# Patient Record
Sex: Female | Born: 1965 | Race: White | Hispanic: No | Marital: Married | Smoking: Never smoker
Health system: Southern US, Community
[De-identification: ages and names within clinical notes are randomized; demographics above are authoritative.]

## PROBLEM LIST (undated history)

## (undated) DIAGNOSIS — F419 Anxiety disorder, unspecified: Secondary | ICD-10-CM

## (undated) DIAGNOSIS — G4730 Sleep apnea, unspecified: Secondary | ICD-10-CM

## (undated) DIAGNOSIS — K219 Gastro-esophageal reflux disease without esophagitis: Secondary | ICD-10-CM

## (undated) DIAGNOSIS — N870 Mild cervical dysplasia: Secondary | ICD-10-CM

## (undated) DIAGNOSIS — Z9889 Other specified postprocedural states: Secondary | ICD-10-CM

## (undated) DIAGNOSIS — K589 Irritable bowel syndrome without diarrhea: Secondary | ICD-10-CM

## (undated) DIAGNOSIS — R0683 Snoring: Secondary | ICD-10-CM

## (undated) DIAGNOSIS — E669 Obesity, unspecified: Secondary | ICD-10-CM

## (undated) DIAGNOSIS — R112 Nausea with vomiting, unspecified: Secondary | ICD-10-CM

## (undated) DIAGNOSIS — I10 Essential (primary) hypertension: Secondary | ICD-10-CM

## (undated) DIAGNOSIS — J069 Acute upper respiratory infection, unspecified: Secondary | ICD-10-CM

## (undated) HISTORY — DX: Sleep apnea, unspecified: G47.30

## (undated) HISTORY — DX: Obesity, unspecified: E66.9

## (undated) HISTORY — PX: GYNECOLOGIC CRYOSURGERY: SHX857

## (undated) HISTORY — DX: Gastro-esophageal reflux disease without esophagitis: K21.9

## (undated) HISTORY — DX: Acute upper respiratory infection, unspecified: J06.9

## (undated) HISTORY — DX: Essential (primary) hypertension: I10

## (undated) HISTORY — DX: Mild cervical dysplasia: N87.0

## (undated) HISTORY — DX: Snoring: R06.83

## (undated) HISTORY — DX: Irritable bowel syndrome, unspecified: K58.9

---

## 1987-11-27 HISTORY — PX: CERVICAL CONE BIOPSY: SUR198

## 1999-01-26 ENCOUNTER — Other Ambulatory Visit: Admission: RE | Admit: 1999-01-26 | Discharge: 1999-01-26 | Payer: Self-pay | Admitting: Gynecology

## 2000-03-19 ENCOUNTER — Other Ambulatory Visit: Admission: RE | Admit: 2000-03-19 | Discharge: 2000-03-19 | Payer: Self-pay | Admitting: Gynecology

## 2000-09-03 ENCOUNTER — Other Ambulatory Visit: Admission: RE | Admit: 2000-09-03 | Discharge: 2000-09-03 | Payer: Self-pay | Admitting: Gynecology

## 2002-03-26 ENCOUNTER — Other Ambulatory Visit: Admission: RE | Admit: 2002-03-26 | Discharge: 2002-03-26 | Payer: Self-pay | Admitting: Gynecology

## 2003-04-20 ENCOUNTER — Other Ambulatory Visit: Admission: RE | Admit: 2003-04-20 | Discharge: 2003-04-20 | Payer: Self-pay | Admitting: Gynecology

## 2003-08-17 ENCOUNTER — Encounter: Admission: RE | Admit: 2003-08-17 | Discharge: 2003-09-22 | Payer: Self-pay | Admitting: Gynecology

## 2003-11-11 ENCOUNTER — Inpatient Hospital Stay (HOSPITAL_COMMUNITY): Admission: AD | Admit: 2003-11-11 | Discharge: 2003-11-15 | Payer: Self-pay | Admitting: Gynecology

## 2003-11-12 ENCOUNTER — Encounter (INDEPENDENT_AMBULATORY_CARE_PROVIDER_SITE_OTHER): Payer: Self-pay

## 2003-12-16 ENCOUNTER — Other Ambulatory Visit: Admission: RE | Admit: 2003-12-16 | Discharge: 2003-12-16 | Payer: Self-pay | Admitting: Dermatology

## 2003-12-21 ENCOUNTER — Other Ambulatory Visit: Admission: RE | Admit: 2003-12-21 | Discharge: 2003-12-21 | Payer: Self-pay | Admitting: Gynecology

## 2004-11-26 HISTORY — PX: CHOLECYSTECTOMY: SHX55

## 2005-01-05 ENCOUNTER — Other Ambulatory Visit: Admission: RE | Admit: 2005-01-05 | Discharge: 2005-01-05 | Payer: Self-pay | Admitting: Gynecology

## 2005-01-08 ENCOUNTER — Ambulatory Visit: Payer: Self-pay | Admitting: Internal Medicine

## 2005-01-09 ENCOUNTER — Inpatient Hospital Stay (HOSPITAL_COMMUNITY): Admission: AD | Admit: 2005-01-09 | Discharge: 2005-01-15 | Payer: Self-pay | Admitting: Internal Medicine

## 2005-04-10 ENCOUNTER — Ambulatory Visit (HOSPITAL_COMMUNITY): Admission: RE | Admit: 2005-04-10 | Discharge: 2005-04-10 | Payer: Self-pay | Admitting: Gynecology

## 2005-11-05 ENCOUNTER — Observation Stay (HOSPITAL_COMMUNITY): Admission: EM | Admit: 2005-11-05 | Discharge: 2005-11-07 | Payer: Self-pay | Admitting: Emergency Medicine

## 2006-01-16 ENCOUNTER — Other Ambulatory Visit: Admission: RE | Admit: 2006-01-16 | Discharge: 2006-01-16 | Payer: Self-pay | Admitting: Gynecology

## 2006-06-04 ENCOUNTER — Ambulatory Visit (HOSPITAL_COMMUNITY): Admission: RE | Admit: 2006-06-04 | Discharge: 2006-06-04 | Payer: Self-pay | Admitting: Gynecology

## 2006-06-06 ENCOUNTER — Encounter: Admission: RE | Admit: 2006-06-06 | Discharge: 2006-06-06 | Payer: Self-pay | Admitting: Gynecology

## 2006-11-26 HISTORY — PX: ESOPHAGOGASTRODUODENOSCOPY: SHX1529

## 2006-12-07 IMAGING — US US ABDOMEN COMPLETE
1 series · 14 of 25 positions shown · non-contrast
Comparison: none

CLINICAL DATA: Abdominal pain, dehydration.
 COMPLETE ABDOMINAL ULTRASOUND:
 Multiple scans of the entire abdomen are made and show the gallbladder to be normal with wall thickness of 2.7 mm.  Common bile duct is normal and measures 4.1 mm in diameter.  The liver, inferior vena cava and pancreas appear to be within the normal limit.  Spleen is normal and measures 12.6 cm.  Right kidney is normal and measures 12.8 cm and the left kidney is normal and measures 12.5 cm.  The abdominal aorta is normal and measures 2.2 cm in maximum diameter.  No free fluid is seen within the abdomen.

[Series 1: unknown · 0.34mm/px · 14 of 69 slices shown]
[im 1/69]
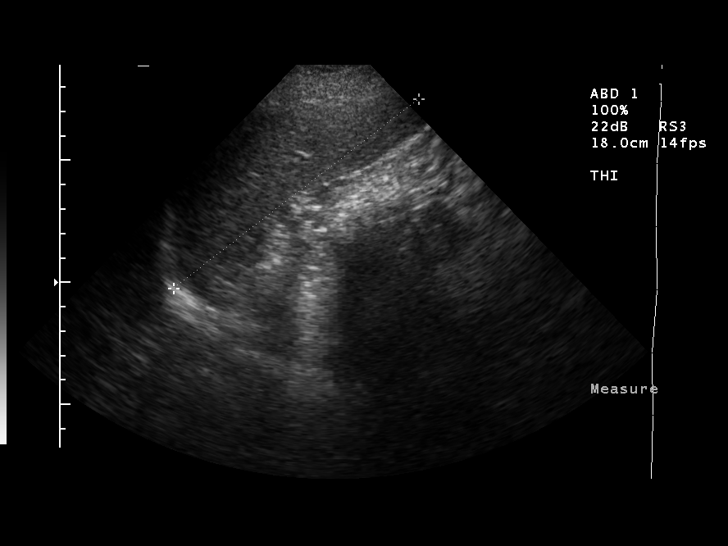
[im 6/69]
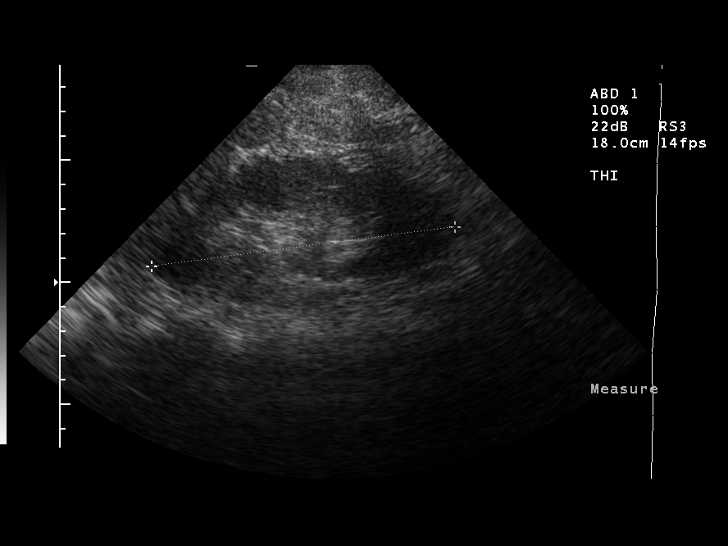
[im 12/69]
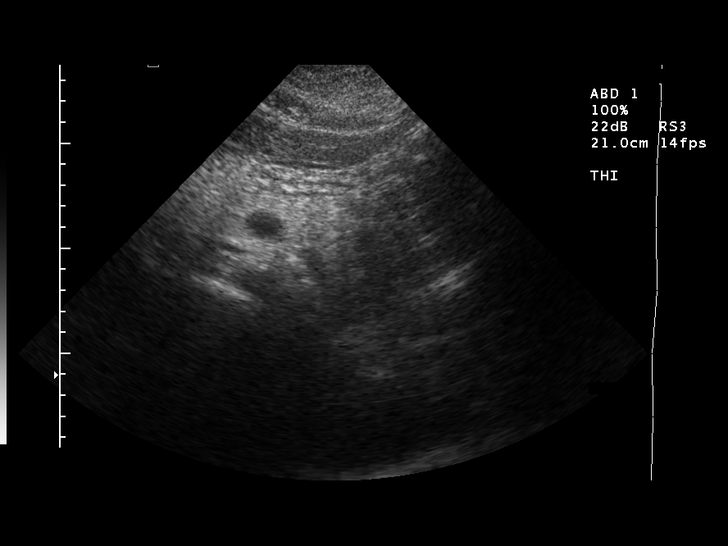
[im 18/69]
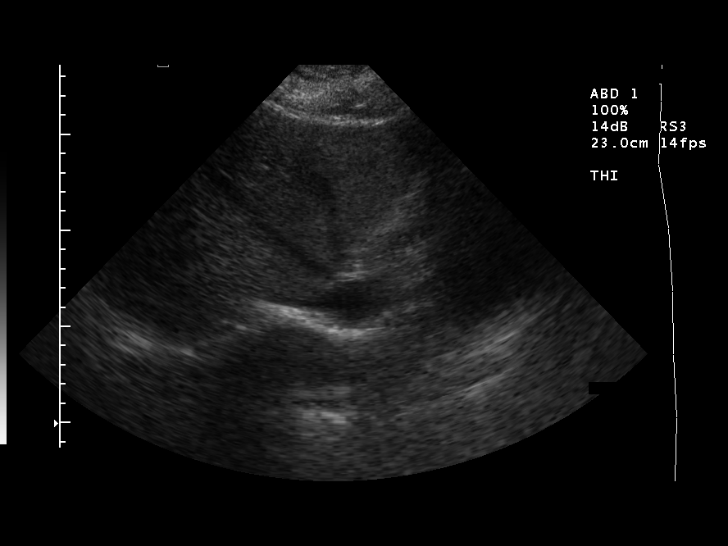
[im 23/69]
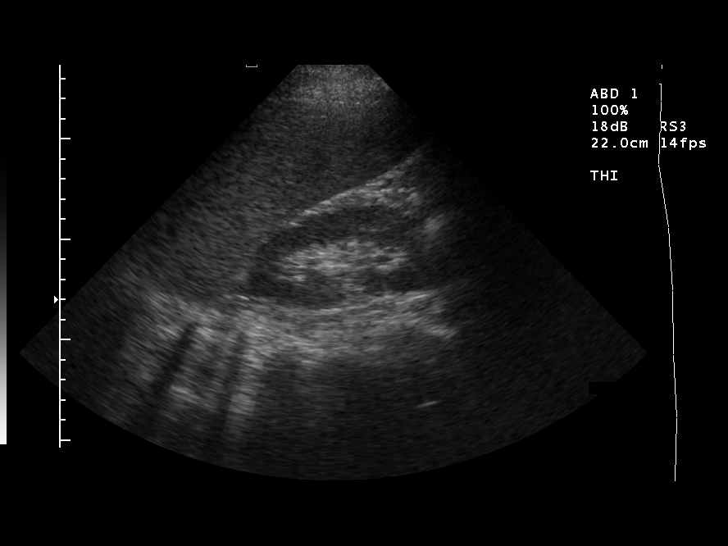
[im 26/69]
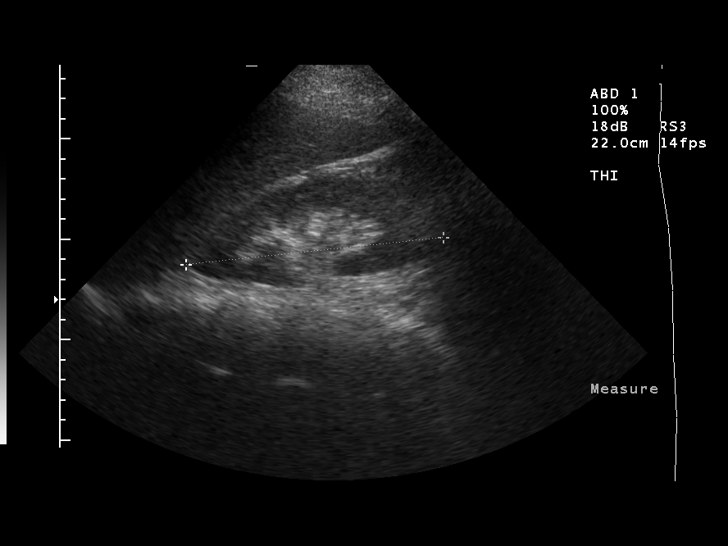
[im 32/69]
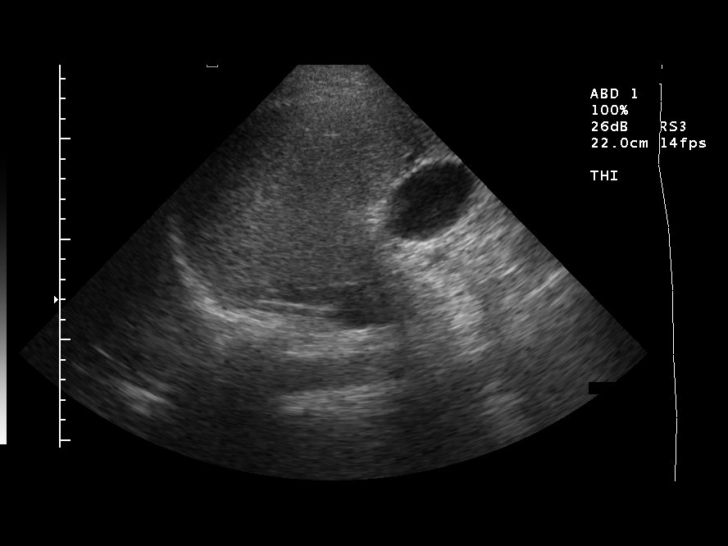
[im 37/69]
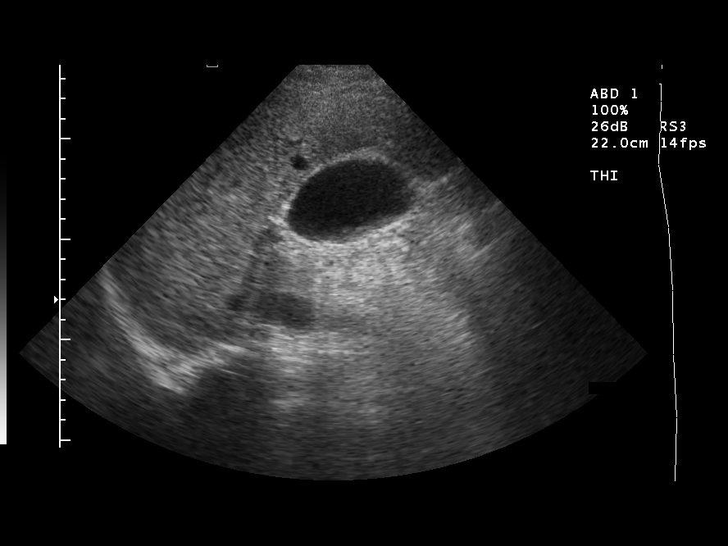
[im 43/69]
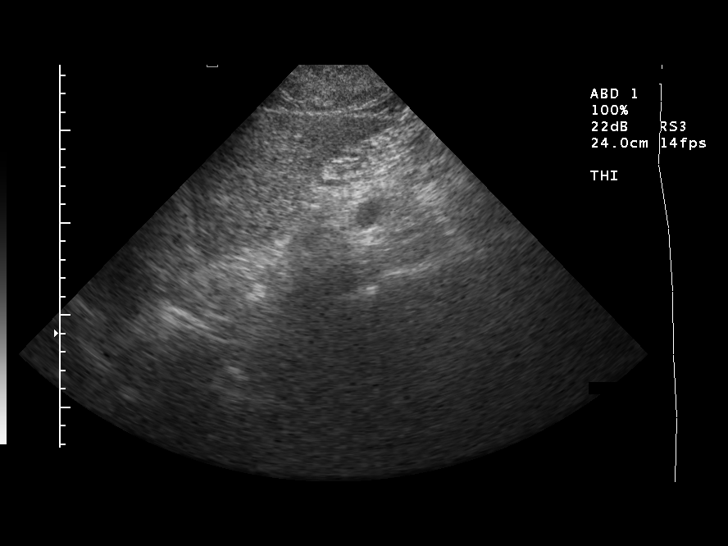
[im 46/69]
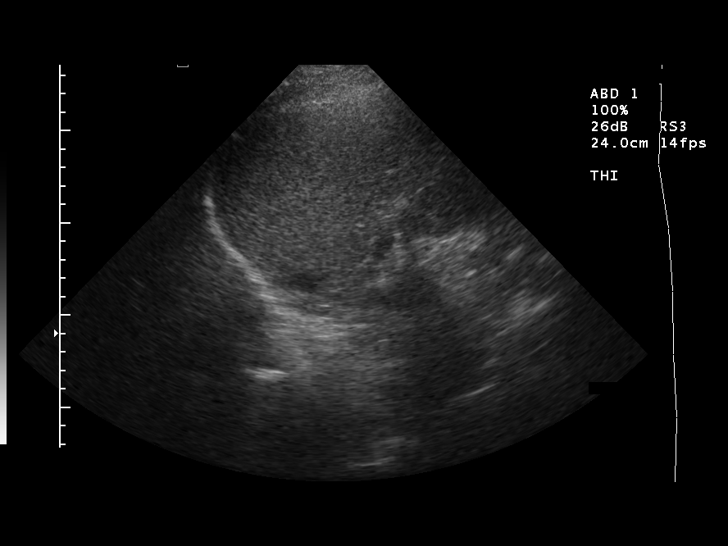
[im 52/69]
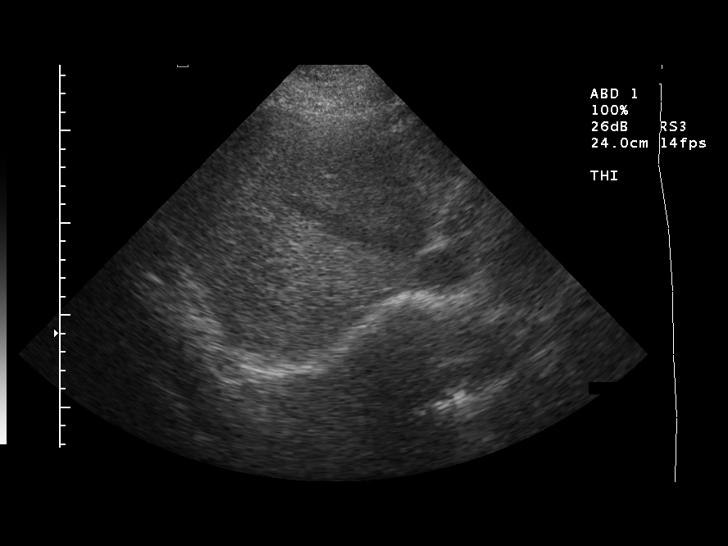
[im 57/69]
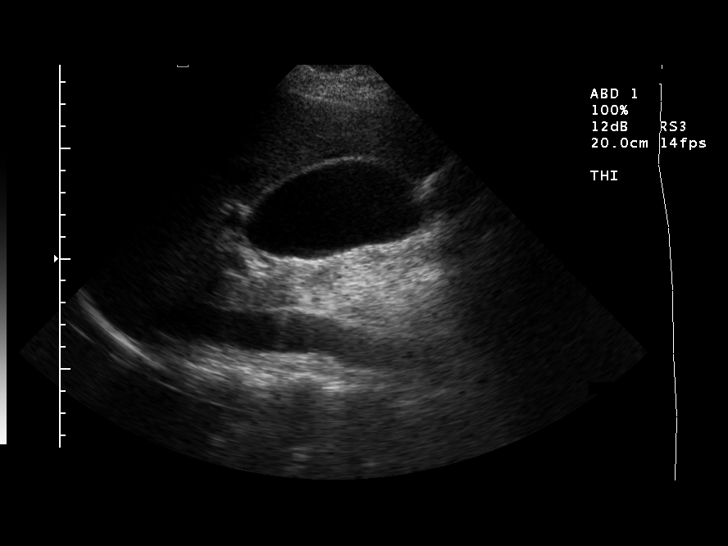
[im 63/69]
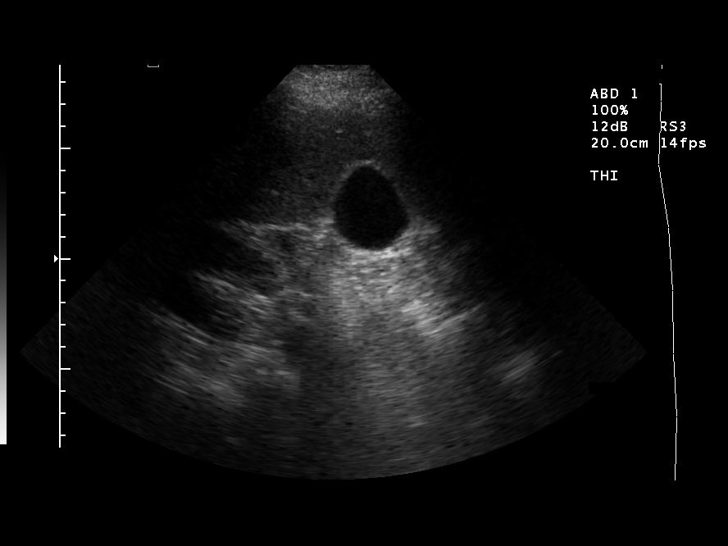
[im 69/69]
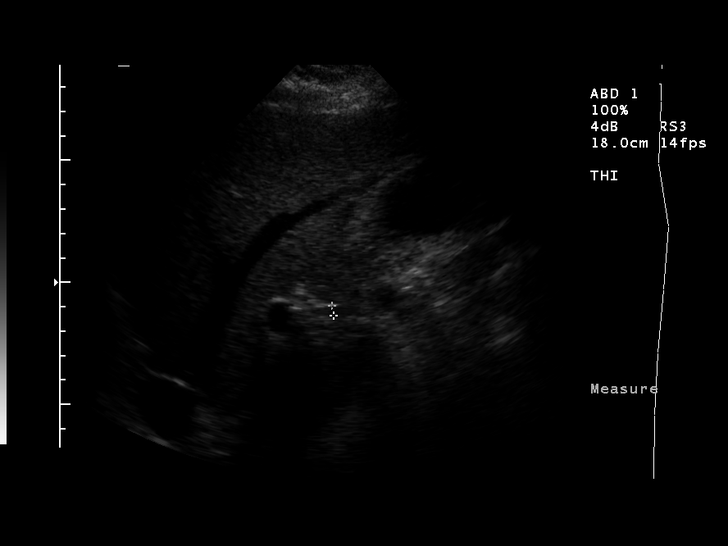

[14 of 25 positions shown; findings below may reference images not displayed]

IMPRESSION: Normal complete abdominal ultrasound.

## 2006-12-09 IMAGING — NM NM HEPATO W/GB/PHARM/[PERSON_NAME]
3 series · 13 of 13 positions shown · non-contrast
Comparison: none

CLINICAL DATA: Abdominal pain.  
 NUCLEAR MEDICINE HEPATOBILIARY SCAN
 Hepatobiliary study was performed after the IV administration of 5.0 mCi Hc-77C Choletec.  Images were performed at different intervals up to one hour.

[Series 1: hepatobiliary · 3.20mm/px · 6 of 60 frames shown (1 of 2)]
[frame 6/60]
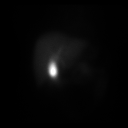
[frame 16/60]
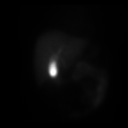
[frame 26/60]
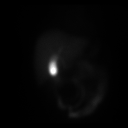
[frame 36/60]
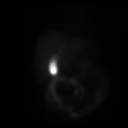
[frame 46/60]
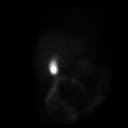
[frame 56/60]
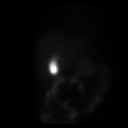

[Series 1: static - general purpose · 2.33mm/px · 1 of 1 slices shown]
[im 1/1]
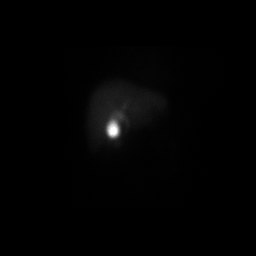

[Series 1: hepatobiliary · 3.20mm/px · 6 of 9 frames shown (2 of 2)]
[frame 1/9]
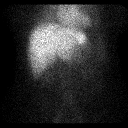
[frame 3/9]
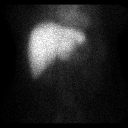
[frame 4/9]
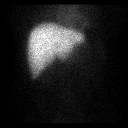
[frame 6/9]
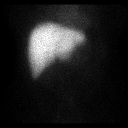
[frame 7/9]
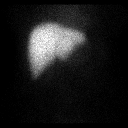
[frame 9/9]
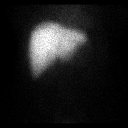

[13 of 13 positions shown; findings below may reference images not displayed]

The first part of the study had to be interrupted because the patient had to go to the restroom; however, at the end of one hour, the activity from the liver was cleared and was concentrated in the gallbladder and small intestine.  Post fatty meal challenge, there was passage of the tracer into the small bowel and the gallbladder emptied with an ejection fraction of 16.5%, suggestive of a moderate degree of biliary dyskinesia/cystic duct syndrome.
IMPRESSION: Decreased gallbladder ejection fraction of 16.5% suggestive of a moderate degree of biliary dyskinesia/cystic duct syndrome.

## 2007-01-21 ENCOUNTER — Other Ambulatory Visit: Admission: RE | Admit: 2007-01-21 | Discharge: 2007-01-21 | Payer: Self-pay | Admitting: Gynecology

## 2007-01-29 ENCOUNTER — Ambulatory Visit: Payer: Self-pay | Admitting: Internal Medicine

## 2007-02-17 ENCOUNTER — Ambulatory Visit (HOSPITAL_COMMUNITY): Admission: RE | Admit: 2007-02-17 | Discharge: 2007-02-17 | Payer: Self-pay | Admitting: Internal Medicine

## 2007-02-17 ENCOUNTER — Ambulatory Visit: Payer: Self-pay | Admitting: Internal Medicine

## 2007-06-18 ENCOUNTER — Ambulatory Visit (HOSPITAL_COMMUNITY): Admission: RE | Admit: 2007-06-18 | Discharge: 2007-06-18 | Payer: Self-pay | Admitting: Gynecology

## 2007-11-27 HISTORY — PX: COLONOSCOPY: SHX174

## 2007-12-22 ENCOUNTER — Emergency Department (HOSPITAL_COMMUNITY): Admission: EM | Admit: 2007-12-22 | Discharge: 2007-12-23 | Payer: Self-pay | Admitting: Emergency Medicine

## 2008-02-17 ENCOUNTER — Other Ambulatory Visit: Admission: RE | Admit: 2008-02-17 | Discharge: 2008-02-17 | Payer: Self-pay | Admitting: Gynecology

## 2008-06-01 ENCOUNTER — Ambulatory Visit: Payer: Self-pay | Admitting: Gastroenterology

## 2008-06-09 ENCOUNTER — Ambulatory Visit (HOSPITAL_COMMUNITY): Admission: RE | Admit: 2008-06-09 | Discharge: 2008-06-09 | Payer: Self-pay | Admitting: Gastroenterology

## 2008-06-09 ENCOUNTER — Encounter: Payer: Self-pay | Admitting: Gastroenterology

## 2008-06-09 ENCOUNTER — Ambulatory Visit: Payer: Self-pay | Admitting: Gastroenterology

## 2008-07-06 ENCOUNTER — Ambulatory Visit (HOSPITAL_COMMUNITY): Admission: RE | Admit: 2008-07-06 | Discharge: 2008-07-06 | Payer: Self-pay | Admitting: Gynecology

## 2008-08-06 ENCOUNTER — Ambulatory Visit: Payer: Self-pay | Admitting: Gastroenterology

## 2009-02-09 ENCOUNTER — Ambulatory Visit: Payer: Self-pay | Admitting: Gastroenterology

## 2009-02-09 DIAGNOSIS — Z9089 Acquired absence of other organs: Secondary | ICD-10-CM

## 2009-02-09 DIAGNOSIS — E669 Obesity, unspecified: Secondary | ICD-10-CM | POA: Insufficient documentation

## 2009-02-09 DIAGNOSIS — R197 Diarrhea, unspecified: Secondary | ICD-10-CM

## 2009-02-09 DIAGNOSIS — K219 Gastro-esophageal reflux disease without esophagitis: Secondary | ICD-10-CM

## 2009-03-01 ENCOUNTER — Ambulatory Visit: Payer: Self-pay | Admitting: Gynecology

## 2009-03-01 ENCOUNTER — Other Ambulatory Visit: Admission: RE | Admit: 2009-03-01 | Discharge: 2009-03-01 | Payer: Self-pay | Admitting: Gynecology

## 2009-03-01 ENCOUNTER — Encounter: Payer: Self-pay | Admitting: Gynecology

## 2009-07-07 ENCOUNTER — Ambulatory Visit (HOSPITAL_COMMUNITY): Admission: RE | Admit: 2009-07-07 | Discharge: 2009-07-07 | Payer: Self-pay | Admitting: Gynecology

## 2009-07-13 ENCOUNTER — Encounter: Payer: Self-pay | Admitting: Gastroenterology

## 2010-01-16 ENCOUNTER — Encounter (INDEPENDENT_AMBULATORY_CARE_PROVIDER_SITE_OTHER): Payer: Self-pay | Admitting: *Deleted

## 2010-03-02 ENCOUNTER — Other Ambulatory Visit: Admission: RE | Admit: 2010-03-02 | Discharge: 2010-03-02 | Payer: Self-pay | Admitting: Gynecology

## 2010-03-02 ENCOUNTER — Ambulatory Visit: Payer: Self-pay | Admitting: Gynecology

## 2010-07-28 ENCOUNTER — Ambulatory Visit: Payer: Self-pay | Admitting: Gynecology

## 2010-12-16 ENCOUNTER — Encounter: Payer: Self-pay | Admitting: *Deleted

## 2010-12-28 NOTE — Letter (Signed)
Summary: Appointment Reminder  [facility]  [address]   [place], [place] [address]   Phone: [phone]  Fax: [phone]       January 16, 2010   [name] [address] [place], [place]  [address] 04-17-1966    Dear Ms. Niemeier,  We have been unable to reach you by phone to schedule a follow up   appointment that was recommended for you by Dr. Darrick Penna. It is very   important that we reach you to schedule an appointment. We hope that you  allow [place] to participate in your health care needs. Please contact us at  [phone] at your earliest convenience to schedule your appointment.  Sincerely,    [facility] R. Roetta Sessions, M.D.    Kassie Mends, M.D. Lorenza Burton, FNP-BC    Tana Coast, PA-C Phone: [phone]    Fax: [phone]

## 2011-04-10 NOTE — Assessment & Plan Note (Signed)
NAME:  [name], [name]                CHART#:  [id number]   DATE:  08/06/2008                       DOB:  21-Mar-1966   REFERRING PHYSICIAN:  Jimmy Footman at Dr. Loraine Leriche Cresenzo's office.   PROBLEM LIST:  1. Diarrhea for 3-4 years.  2. Cholecystectomy.  3. Severe obesity with a BMI of 39.5  4. Gastroesophageal reflux disease.   SUBJECTIVE:  The patient is a 45 year old female, who was last seen in  July 2009 for colonoscopy.  She was complaining of diarrhea.  She has  had diarrhea since she became a mother of 7-year-old.  In July 2009, her  biopsies were negative for microscopic colitis.  She was asked to use  Levsin, fiber, and Colestid.  She reports having 1-2 bowel movements in  the morning, sometimes before she eats.  She has one after lunch.  She  does not have this problem after dinner.  She has formed stool once in  a while.  She has cut down on fatty foods, but not entirely eliminated  them.  She rarely consumes dairy products, but she had ice cream  today.  She takes one Levsin before breakfast and lunch.  She denies any  more stress at work in the home.  She denies having dry mouth, dry eyes,  drowsiness, or urinary retention.  She cannot really tell that the  Colestid may be any different.  She does have pain in her abdomen prior  to having diarrhea, which is relieved after the bowel movement.   MEDICATIONS:  1. Nexium 40 mg daily.  2. Levsin.  3. Colestid.  4. FiberChoice 2 pills daily.   OBJECTIVE:  Physical exam:  VITAL SIGNS:  Weight 209 pounds (up 4 pounds since January 2008), height  5 foot 1 inch, BMI 39.5 (severely obese), temperature 92, blood pressure  124/88, pulse 16.GENERAL:  She is in no apparent distress.  Alert and  oriented x4.  HEENT:  Atraumatic, normocephalic.  Pupils equal and reactive to light.  Mouth, no oral lesions.  Posterior pharynx without erythema or  exudate.NECK:  Full range of motion.  No lymphadenopathy.LUNGS:  Clear  to auscultation  bilaterally.  CARDIOVASCULAR:  Regular rhythm.  No murmur.  Normal S1 and S2.ABDOMEN:  Bowel sounds present.  Soft, nontender, and nondistended.  No rebound or  guarding.EXTREMITIES:  No cyanosis, clubbing, or edema.NEUROLOGIC:  She  has no focal neurologic deficits.   ASSESSMENT:  The patient is a 45 year old female, who has chronic  intermittent diarrhea.  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  The differential diagnosis includes lactose  intolerance, bile salt induced diarrhea, small bowel bacterial  overgrowth, and celiac sprue. Thank you for allowing me to see the  patient in consultation.  My recommendations are follow.   RECOMMENDATIONS:  1. She should increase the Levsin to 2 before breakfast and 2 before      lunch.  She is given her discharge instructions in writing.  She is      cautioned that Levsin again can  cause drowsiness, dry mouth, dry      eyes, and urinary retention.  2. She is to add Colestid 2 in the morning and 1 at night.  3. She is to strictly adhere to a low-fat diet indefinitely and is  going to be placed on a trial of a lactose-free diet for 2 weeks.      If her symptoms do not improve, she has not to worry about dairy      products.  4. Will schedule an upper endoscopy within the next 2-3 weeks to      evaluate for celiac sprue.  5. She has a follow up appointment to see me in 6 weeks.         Kassie Mends, M.D.  Electronically Signed     SM/MEDQ  D:  08/06/2008  T:  08/06/2008  Job:  [id number]   cc:   Susette Racer, M.D.

## 2011-04-10 NOTE — Op Note (Signed)
NAMERHILEE, [name]               ACCOUNT NO.:  [id number]   MEDICAL RECORD NO.:  [id number]          PATIENT TYPE:  AMB   LOCATION:  DAY                           FACILITY:  [facility]   PHYSICIAN:  Kassie Mends, M.D.      DATE OF BIRTH:  Jul 19, 1966   DATE OF PROCEDURE:  06/09/2008  DATE OF DISCHARGE:                               OPERATIVE REPORT   REFERRING Charles Niese:  Margarita Mail with Dr. Patrica Duel.   PROCEDURE:  Colonoscopy with random cold forceps biopsy.   INDICATION FOR EXAMINATION:  [name] is a 45 year old female who had  a gallbladder taken out 3-4 years ago and has had diarrhea.  She  presented with fecal incontinence.  She also has strained fecal urgency  after eating.  She said Bentyl only made her have abdominal cramps and  not go to the bathroom.  She does not recall taking Levsin.  She is  trying fiber.   FINDINGS:  1. Frequent sigmoid colon diverticula.  Otherwise, no polyps, masses,      inflammatory changes, or AVMs seen.  Random biopsies obtained via      cold forceps to evaluate for microscopic colitis.  2. Small internal hemorrhoids.  Otherwise, normal retroflexed view of      the rectum.   DIAGNOSIS:  No obvious source for [name] diarrhea identified.   RECOMMENDATIONS:  1. I will add Levsin 0.125 mg sublingual 30 minutes before breakfast,      lunch, and dinner.  We will also add Colestid 1 g daily and      titrate.  There is a strong possibility that her diarrhea is bile      salt induced.  2. Followup appointment with Dr. Cira Servant in 1 month.  3. She should follow a high-fiber diet.  She is given a handout on      high-fiber diet, diverticulosis, and hemorrhoids.  4. No aspirin, NSAIDs, or anticoagulation for 5 days.   MEDICATIONS:  1. Demerol 75 mg IV.  2. Versed 4 mg IV.   PROCEDURE TECHNIQUE:  Physical exam was performed.  Informed consent was  obtained from the patient after explaining benefits, risks, and  alternatives to the procedure.   The patient was connected to monitor and  placed in left lateral position.  Continuous oxygen was provided by  nasal cannula.  IV medicines were administered through an indwelling  cannula.  After administration of sedation and rectal exam, the  patient's rectum was intubated and the  scope was advanced under direct visualization to the cecum.  The scope  was removed slowly by carefully examining color, texture, anatomy, and  integrity of the mucosa on the way out.  The patient was recovered in  endoscopy and discharged home in satisfactory condition.      Kassie Mends, M.D.  Electronically Signed     SM/MEDQ  D:  06/09/2008  T:  06/09/2008  Job:  [id number]   cc:   Patrica Duel, M.D.  Fax: [phone]   Margarita Mail, PA

## 2011-04-10 NOTE — Consult Note (Signed)
NAMEHERA, [name]               ACCOUNT NO.:  [id number]   MEDICAL RECORD NO.:  [id number]          PATIENT TYPE:  AMB   LOCATION:  DAY                           FACILITY:  [facility]   PHYSICIAN:  Kassie Mends, M.D.      DATE OF BIRTH:  1966/10/25   DATE OF CONSULTATION:  DATE OF DISCHARGE:                                 CONSULTATION   REASON FOR CONSULTATION:  IVS symptoms.   PHYSICIAN REQUESTING CONSULTATION:  Jimmy Footman with Dr. Patrica Duel.   PHYSICIAN CONCERNING NOTE:  Kassie Mends, MD.   HISTORY OF PRESENT ILLNESS:  [name] is a 45 year old lady who presents  today for further evaluation of IVS symptoms.  We last saw her in March  2008.  She was having refractory GERD at that time and underwent an EGD  and was found to have small sliding hiatal hernia and nonerosive antral  gastritis.  Her H. pylori serologies were negative.  She states that she  has had issues with her bowel movements ever since the birth of her  child who is now 9 years old.  She had a cesarean section with him.  Ever since then, she has had fecal urgency, especially postprandially.  She has anywhere from 5 minutes to an hour and half after eating, will  have fecal urgency with little notice to get to the bathroom.  She has  had episodes of incontinence.  This usually occurs in the mornings and  after lunch, rarely does occur after her evening meal.  She never has  nocturnal bowel movements.  Rarely does she have a solid stool.  She  generally has 3-4 stools a day.  No blood or melena.  No unintentional  weight loss.  She also has gastroesophageal reflux disease.  When she  has breakthrough symptoms which she usually occurs 2 times a week, it is  usually nocturnally.  She is already on Nexium 40 mg daily.  She  previously failed Prilosec.  For her bowel symptoms, she has been on  Align daily for 2 weeks and has been eating Fiber One bars for the past  3 weeks.  She really has not noticed any difference.  She  previously  failed Levsin and Bentyl.  She denies any significant abdominal pain  other than the urge to have a bowel movement.  This pain goes away as  soon as she has a bowel movement.  She has had no vomiting.  No  dysphagia or odynophagia.  The patient states her thyroid was checked  with labs in February 2009 were normal.   CURRENT MEDICATIONS:  1. Nexium 40 mg daily.  2. Prenatal vitamin daily.  3. Align daily.  4. Naproxen 1-2 daily, she recently started for pain.   ALLERGIES:  PENICILLIN.   PAST MEDICAL HISTORY:  Gastroesophageal reflux disease, anxiety,  depression, she has a history of gestational diabetes, prior EGD as  outlined above, cesarean section in 2004, and cholecystectomy in 2006.   FAMILY HISTORY:  Negative for colorectal cancer or chronic GI illnesses.   SOCIAL HISTORY:  She is married.  She has a 47-year-old son.  She is a  Engineer, site at [employer].  She has never been a  smoker.  Occasionally drinks beer.   REVIEW OF SYSTEMS:  See HPI for GI.  CONSTITUTIONAL:  No unintentional  weight loss.  CARDIOPULMONARY:  No chest pain, shortness of breath,  palpitations, or cough.  GENITOURINARY:  No dysuria or hematuria.   PHYSICAL EXAM:  VITAL SIGNS:  Weight 211, temp 98.8, blood pressure  130/96, and pulse 72.  GENERAL:  Pleasant obese Caucasian female in no acute distress.  SKIN:  Warm and dry.  No jaundice.  HEENT:  Sclerae nonicteric.  Oropharyngeal mucosa moist and pink.  No  lesions, erythema, or exudate.  CARDIAC:  Reveals regular rate and rhythm.  No murmurs, rubs, or  gallops.  CHEST:  Lungs clear to auscultation.  ABDOMEN:  Positive bowel sounds, soft, nontender, and nondistended.  No  organomegaly or masses.  No rebound or guarding.  No abdominal bruits or  hernias.  LOWER EXTREMITIES:  No edema.   IMPRESSION:  [name] is a 45 year old lady with chronic diarrhea now  occurring for about 4 years.  Very little abdominal pain  associated with  these symptoms.  She has significant postprandial fecal urgency as well  as periods of fecal incontinence.  Symptoms may be secondary to  irritable bowel syndrome, but other etiologies need be excluded and  including celiac disease, irritable bowel syndrome, and less likely  microscopic colitis.  She has chronic gastroesophageal reflux disease  with breakthrough symptoms couple times a week, but no alarm symptoms at  this time.   PLAN:  1. IBS and GERD literature provided to the patient.  She will stop      eating peppermint which she has been doing for breakthrough reflux.  2. FiberChoice 2 tablets daily.  3. Trial of HyoMax 0.375 mg one b.i.d. p.r.n. diarrhea and cramps, #60      one refill.  4. Complete 4-week course of the Align.  5. Continue Nexium 30 minutes before breakfast and may take an      additional dose of 30 minutes before supper if needed or may take      OTC acid reducer in the evenings as needed for breakthrough      symptoms.  6. Celiac antibody panel, CMET, CBC.  7. Colonoscopy in near future with Dr. Cira Servant.  8. Further recommendations to follow.   I would like to thank Jimmy Footman with Dr. Patrica Duel for allowing [place]  to take part in the care of this patient.      Tana Coast, P.A.      Kassie Mends, M.D.  Electronically Signed    LL/MEDQ  D:  06/01/2008  T:  06/02/2008  Job:  [id number]   cc:   Patrica Duel, M.D.  Fax: [phone]

## 2011-04-13 NOTE — H&P (Signed)
NAME:  [name], [name]                         ACCOUNT NO.:  [id number]   MEDICAL RECORD NO.:  [id number]                   PATIENT TYPE:  INP   LOCATION:  9160                                 FACILITY:  [facility]   PHYSICIAN:  Timothy P. Fontaine, M.D.           DATE OF BIRTH:  07-17-1966   DATE OF ADMISSION:  11/11/2003  DATE OF DISCHARGE:                                HISTORY & PHYSICAL   CHIEF COMPLAINT:  1. Pregnancy at term.  2. Gestational diabetes, insulin requiring.   HISTORY OF PRESENT ILLNESS:  A 45 year old G1 P0 female at [redacted] weeks  gestation with gestational diabetes requiring p.m. NPH insulin for control.  The patient is admitted at this time for induction due to her insulin-  dependent diabetes.  Her prenatal course has otherwise been uncomplicated.  She is advanced maternal age and has declined amniocentesis.  For the  remainder of her history, see her [place].   PHYSICAL EXAMINATION:  VITAL SIGNS:  Afebrile, vital signs are stable.  HEENT:  Normal.  LUNGS:  Clear.  CARDIAC:  Regular rate without rubs, murmurs, or gallops.  ABDOMEN:  Gravid, vertex fetus.  External monitor shows reactive fetal  tracing, contractions every two to three minutes.  PELVIC:  Cervix 50%, fingertip, -3 station.   ASSESSMENT AND PLAN:  A 45 year old gravida 1 para 0 at 39 weeks,  gestational diabetes insulin dependent, for serial induction.                                               Timothy P. Audie Box, M.D.    TPF/MEDQ  D:  11/12/2003  T:  11/12/2003  Job:  [id number]

## 2011-04-13 NOTE — H&P (Signed)
[name], [name]               ACCOUNT NO.:  [id number]   MEDICAL RECORD NO.:  [id number]          PATIENT TYPE:  INP   LOCATION:  A201                          FACILITY:  [facility]   PHYSICIAN:  Patrica Duel, M.D.    DATE OF BIRTH:  12-11-1965   DATE OF ADMISSION:  11/05/2005  DATE OF DISCHARGE:  LH                                HISTORY & PHYSICAL   CHIEF COMPLAINT:  Chest pain, nausea and vomiting.   HISTORY OF PRESENT ILLNESS:  This is a 45 year old female with a history of  gastroesophageal reflux disease, allergic rhinitis. She underwent  cholecystectomy in February of this year for acute cholecystitis.   The patient developed nausea, vomiting and diarrhea approximately 24 hours  prior to presenting to the emergency department. She denies any melena,  hematemesis or hematochezia. She developed very hard substernal chest pain  soon after an episode of emesis. She also became diaphoretic and near  syncopal. The pain persisted, and she was brought to the emergency  department.   In the emergency department, cardiac enzymes were negative. EKG was  nonacute. Other pertinent parameters include mildly elevated white count of  10,600. Otherwise normal hemogram, normal MET7 and liver functions with a  mildly elevated SGOT of 57. Chest x-ray was negative. O2 saturation 100%.   Risk factors for coronary disease are unknown lipid status only. Her family  history is negative for coronary artery disease. She is a nonsmoker and has  no history of hypertension.   The patient is admitted with chest pain which is somewhat atypical for  coronary syndrome and probably related to gastroenteritis/esophagitis.   There is no history of headache, neurological deficits, cough, hemoptysis,  abdominal pain, or genitourinary symptoms.   PAST HISTORY:  As noted. She is also status post Cesarean section.   FAMILY HISTORY:  As noted above.   REVIEW OF SYSTEMS:  Negative except as mentioned.   SOCIAL HISTORY:  As noted.   PHYSICAL EXAMINATION:  GENERAL:  This is a very pleasant, fully alert female  who is moderately obese. She is in no acute distress.  VITAL SIGNS:  BP 134/67. She is afebrile. Pulse 85, respirations 20. O2  saturation 100%.  HEENT:  Normocephalic, atraumatic. There is no scleral icterus. Ears, nose  and throat are benign.  NECK:  Supple. No bruits, thyromegaly or lymphadenopathy.  LUNGS:  Clear.  HEART:  Sounds are normal without murmurs, rubs or gallops.  ABDOMEN:  Nontender, nondistended. Bowel sounds are intact.  EXTREMITIES:  No clubbing, cyanosis, or edema.  NEUROLOGICAL:  Fully intact.   ASSESSMENT:  Somewhat atypical chest after an episode of emesis. Consider  coronary syndrome but seems unlikely. Mild elevation of SGOT of questionable  significance. Known gastroesophageal reflux disease.   PLAN:  Will admit for rule out MI protocol. Repeat ultrasound of the liver  and gallbladder bed to rule out the possibility of common duct stone though  her symptoms are somewhat atypical. Also obtain hepatitis screen and workup  liver functions pending her progress.      Patrica Duel, M.D.  Electronically Signed  MC/MEDQ  D:  11/06/2005  T:  11/06/2005  Job:  [id number]

## 2011-04-13 NOTE — Discharge Summary (Signed)
[name], [name]               ACCOUNT NO.:  [id number]   MEDICAL RECORD NO.:  [id number]          PATIENT TYPE:  INP   LOCATION:  A306                          FACILITY:  [facility]   PHYSICIAN:  Madelin Rear. Sherwood Gambler, MD  DATE OF BIRTH:  Jun 17, 1966   DATE OF ADMISSION:  01/09/2005  DATE OF DISCHARGE:  02/20/2006LH                                 DISCHARGE SUMMARY   DISCHARGE MEDICATIONS:  1.  Biaxin 500 mg p.o. b.i.d.  2.  Flonase nasal spray 0.5%, two sprays b.i.d. p.r.n.  3.  Astelin nasal spray, two sprays b.i.d. p.r.n.  4.  Demerol 50 mg tablets q.4h. p.r.n. pain.   DISCHARGE DIAGNOSES:  1.  Acute cholecystitis.  2.  Allergic rhinitis.  3.  Gastroesophageal reflux disease.  4.  Acute sinusitis.  5.  Hypokalemia.   SUMMARY:  The patient was admitted with abdominal pain and recurrent  vomiting.  She was notably tender in the right upper quadrant.  Subsequent  workup including a hepatobiliary scan showed a calculus cholecystitis.  She  was hydrated in the hospital, given adequate analgesic and antibiotic  coverage.  She was seen in consultation by Dr. Katrinka Blazing for surgery who  concurred with the diagnosis.  She was also seen with gastroenterology as  well.  Subsequently, she underwent laparoscopic cholecystectomy with  improvement and discharged stable.      LJF/MEDQ  D:  01/24/2005  T:  01/24/2005  Job:  [id number]

## 2011-04-13 NOTE — H&P (Signed)
NAMEDERRICK, ORRIS               ACCOUNT NO.:  [id number]   MEDICAL RECORD NO.:  [id number]          PATIENT TYPE:  INP   LOCATION:  A226                          FACILITY:  [facility]   PHYSICIAN:  Madelin Rear. Sherwood Gambler, MD  DATE OF BIRTH:  20-Oct-1966   DATE OF ADMISSION:  01/09/2005  DATE OF DISCHARGE:  LH                                HISTORY & PHYSICAL   CHIEF COMPLAINT:  Abdominal pain and vomiting.   HISTORY OF PRESENT ILLNESS:  The patient presents with one day of severe  polymyalgia followed immediately by abdominal pain and vomiting  repetitively.  She had intractable emesis and has not held any fluids down  for 24 hours.  She does admit to some thirst.  She has persistent epigastric  and right upper quadrant pain.  She did admit to possibly some brownish-  coffee ground emesis.  She denied hematemesis, and no hematochezia or melena  was noted.  No fever, rigors, or chills.  No cough or sputum.   PAST MEDICAL HISTORY:  1.  Gastroesophageal reflux disease.  2.  Long-term use of Wellbutrin.   SOCIAL HISTORY:  Nonsmoker, nondrinker of alcohol.  Has a 53-month-old child  at home that she cares for.  Is married and lives with her husband,  supportive family.   FAMILY HISTORY:  Positive for hypertension.  Paternal grandmother deceased  from cancer of unknown source and maternal grandmother with asthma, sister  with hypertension.   REVIEW OF SYSTEMS:  As under HPI.  All else is negative.  She did admit to  some brief episodes of chest discomfort during this, possibly related to the  vomiting.  No palpitations.   PHYSICAL EXAMINATION:  GENERAL:  She appears distressed, actively throwing  up in the office.  HEAD AND NECK:  No JVD or adenopathy.  Dry mucous membranes are noted.  CHEST:  Clear.  CARDIAC:  Regular rhythm without murmur, gallop, or rub.  ABDOMEN:  Normoactive bowel sounds.  No organomegaly or masses.  Positive  epigastric and right upper quadrant tenderness with a  weakly positive  Murphy's sign.  No guarding or rebound.  EXTREMITIES:  Without clubbing, cyanosis, or edema.  NEUROLOGIC:  Normal.   LABORATORY DATA AND OTHER STUDIES:  Laboratories are pending at present,  will review when available.   IMPRESSION:  Differential diagnostic possibilities include peptic ulcer  disease, upper gastrointestinal bleed, Mallory-Weiss tear, cholecystitis,  pancreatitis, hepatitis, and bowel obstruction. These issues will be  resolved with review of laboratory testing.  May also need to get her  admitted for IV hydration and symptom control.      LJF/MEDQ  D:  01/09/2005  T:  01/09/2005  Job:  [id number]

## 2011-04-13 NOTE — Op Note (Signed)
NAME:  [name], [name]                         ACCOUNT NO.:  [id number]   MEDICAL RECORD NO.:  [id number]                   PATIENT TYPE:  INP   LOCATION:  9160                                 FACILITY:  [facility]   PHYSICIAN:  Timothy P. Fontaine, M.D.           DATE OF BIRTH:  1966-01-06   DATE OF PROCEDURE:  11/12/2003  DATE OF DISCHARGE:                                 OPERATIVE REPORT   PREOPERATIVE DIAGNOSES:  1. Pregnancy at term.  2. Gestational diabetes, insulin dependent.  3. Nonreassuring fetal tracing.   POSTOPERATIVE DIAGNOSES:  1. Pregnancy at term.  2. Gestational diabetes, insulin dependent.  3. Nonreassuring fetal tracing.  4. Leiomyomata.   PROCEDURE:  Primary low transverse cervical cesarean section.   SURGEON:  Timothy P. Fontaine, M.D.   ASSISTANT:  Scrub technician.   ANESTHETIC:  Spinal.   ESTIMATED BLOOD LOSS:  500 mL.   SPECIMENS:  1. Placenta.  2. Samples of cord blood.   FINDINGS:  At 2006, normal female, Apgars 9 and 9, weight 5 pounds 12 ounces,  nuchal cord x 1 noted, posterior mid subserosal leiomyomata approximately 2  cm noted.  Fallopian tubes and ovaries grossly normal bilaterally.   DESCRIPTION OF PROCEDURE:  The patient was taken to the operating room,  underwent spinal anesthesia, was placed in left total supine position,  received an abdominal preparation with Betadine solution, Foley catheter  placed in sterile technique, and the patient was draped in the usual  fashion.  After assuring adequate anesthesia, the abdomen was sharply  entered through a Pfannenstiel incision achieving adequate hemostasis at all  levels.  Bladder flap was sharply and bluntly developed without difficulty.  Uterus sharply entered and lower uterine segment bluntly extended laterally.  The membranes were ruptured, the fluid noted to be clear.  The infant's head  delivered through the incision.  Nares and mouth suctioned.  The nuchal cord  reduced.  The rest of  the infant delivered, the cord doubly clamped and cut,  and the infant was handed to pediatrics in attendance.  Samples of cord  blood were obtained.  The placenta was then spontaneously extruded, noted to  be intact and was sent to pathology.  The uterus was exteriorized.  The  endometrial cavity explored with a sponge to remove all placental membrane  fragments.  The patient received 1 g ciprofloxacin IV prophylaxis.  The  uterine incision was closed in one layer using 0 Vicryl suture in a running  interlocking stitch.  Two figure-of-eight sutures were placed at the  incision line to achieve ultimate hemostasis.  The uterus was returned to  the abdomen which was copiously irrigated.  Adequate hemostasis visualized.  The anterior fascia was reapproximated using 0 Vicryl suture in a running  stitch.  The subcutaneous tissue was irrigated.  Hemostasis achieved with  electrocautery.  The skin reapproximated using 4-0 Vicryl in a running  subcuticular stitch.  Benzoin and  Steri-Strips applied.  Sterile dressing  applied.  The patient taken to the recovery room in good condition having  tolerated the procedure well.                                               Timothy P. Audie Box, M.D.    TPF/MEDQ  D:  11/12/2003  T:  11/14/2003  Job:  [id number]

## 2011-04-13 NOTE — Op Note (Signed)
[name], [name]               ACCOUNT NO.:  [id number]   MEDICAL RECORD NO.:  [id number]          PATIENT TYPE:  INP   LOCATION:  A306                          FACILITY:  [facility]   PHYSICIAN:  Dirk Dress. Katrinka Blazing, M.D.   DATE OF BIRTH:  1966-10-25   DATE OF PROCEDURE:  01/14/2005  DATE OF DISCHARGE:                                 OPERATIVE REPORT   PREOPERATIVE DIAGNOSIS:  Cholecystitis, acalculous type.   POSTOPERATIVE DIAGNOSIS:  Acalculous cholecystitis.   PROCEDURE:  Laparoscopic cholecystectomy.   SURGEON:  Dr. Katrinka Blazing.   DESCRIPTION:  Under general anesthesia, the patient's abdomen was prepped  and draped in the sterile field. Supraumbilical incision was made and Veress  needle was inserted uneventfully. The abdomen was insufflated with 3 liters  of CO2. Using a Visiport guide, a 10- mm port was placed uneventfully.  Laparoscope was placed. The gallbladder was visualized. Under videoscopic  guidance, a 10-mm port and two 5-mm ports were placed without difficulty.  The gallbladder was grasped and positioned. The cystic artery was dissected,  clipped with four clips and divided. Cystic duct was dissected, clipped with  four clips, and divided. Using electrocautery _____________ , the  gallbladder was separated from the intrahepatic bed without difficulty. It  was placed in an EndoCatch device and retrieved. There was minimal bleeding  of the bed of the liver. Hemostasis was adequate. There was no evidence of  bile leak. Fluid was used to irrigate the area, but the fluid was clear. The  patient tolerated the procedure well. CO2 was allowed to escape from the  abdomen and the ports were removed. The incisions were then closed using 0  Vicryl on the fascia, the supraumbilical incision, staples on the skin of  all of the incisions. Dressings were placed.   The patient was awakened from anesthesia uneventfully, transferred to a bed  and taken to the postanesthetic care unit for  monitoring.      LCS/MEDQ  D:  01/14/2005  T:  01/15/2005  Job:  [id number]

## 2011-04-13 NOTE — Consult Note (Signed)
[name], [name]               ACCOUNT NO.:  [id number]   MEDICAL RECORD NO.:  [id number]          PATIENT TYPE:  INP   LOCATION:  A226                          FACILITY:  [facility]   PHYSICIAN:  R. Roetta Sessions, M.D. DATE OF BIRTH:  1965-12-20   DATE OF CONSULTATION:  01/10/2005  DATE OF DISCHARGE:                                   CONSULTATION   REASON FOR CONSULTATION:  Nausea, vomiting, and abdominal pain.   HISTORY OF PRESENT ILLNESS:  [name] is a pleasant, obese, 45-year-  old Caucasian female with a history of gestational diabetes.  She was  admitted to the hospital on 01/09/2005 after developing acute illness  characterized by being awakened from a sound sleep at about 0100 on  01/09/2005 with nausea and vomiting which was incessant for the next several  hours. She started having some vague epigastric pain along with these  symptoms as well. She has had not had any melena, rectal bleeding, diarrhea,  or constipation. She has not had any fever or chills.  There has been no  obvious hematemesis.  She was admitted to the hospital by Dr. Sherwood Gambler for  further evaluation.  Acute abdominal series done on February 14 demonstrated  a normal bowel gas pattern. Labs also revealed an essentially normal CBC  with a white count of 9.6, H&H 13.9 and 40.2.  Sodium 132, potassium 3.6,  chloride 102, CO2 25, glucose 111, BUN 7, creatinine 0.8, total bilirubin  0.4, alkaline phosphatase 92.  SGOT 18, SGPT 18, albumin 3.4, amylase 62,  lipase 17, beta hCG was negative.   Abdominal ultrasound was done today.  I just reviewed it with Dr. Lorin Donaldson.  The hepatobiliary tree appeared normal.  A slightly echogenic and __________  value; pancreas, normal aorta and kidneys.   [name] is feeling better with symptomatic treatment including Protonix,  Zofran, and IV fluids.  She wants to have something to eat and drink.   No one else around her has been sick.  She denies any unusual travel or  food  intake.  She has well water at home, but has not had any apparent problems.  With it she denies any similar illness.  No history of peptic ulcer disease.  She does have chronic reflux disease which she describes as heartburn, but  no odynophagia nor dysphagia.  She has been on Nexium at home for some time.  She takes it regularly, she has no symptoms.  She relates having heartburn  for approximately 3 years.  She does not take any nonsteroidal agents.  There is no history of any prior gastrointestinal illnesses.  She has a  history of gestational diabetes which required insulin back in 2004.  She  does check her blood sugars somewhat sporadically and tells me her blood  sugars have been within normal limits without any specific therapy.  She has  lost 11 pounds since November as an attempt to become more healthy.   PAST MEDICAL HISTORY:  Gastroesophageal reflux disease with a history of  Wellbutrin use, status post C-section in 2004 in [place].  ALLERGIES:  To PENICILLIN.   FAMILY HISTORY:  Negative for chronic GI or liver disease.   SOCIAL HISTORY:  The patient does not use alcohol to tobacco.  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  She has  a 50-month-old child at home. She has a supportive husband. She also has a  51 year old step-daughter at home.   REVIEW OF SYSTEMS:  As in history of present illness.  No chest pain, or  dyspnea on exertion.   PHYSICAL EXAMINATION:  GENERAL:  Reveals a 45 year old lady, resting  comfortably in no acute distress.  VITAL SIGNS:  Temperature 97.8, pulse 104, BP 111/69.  Her weight is 195  pounds.  SKIN:  Warm and dry.  No jaundice.  No cutaneous stigmata of chronic liver  disease.  HEENT:  No scleral icterus. Conjunctivae are pink.  Oral cavity has no  lesions.  NECK:  JVD is not prominent.  CHEST:  Lungs are clear to auscultation.  CARDIAC:  Regular rate and rhythm without murmur, gallop, or  rub.  BREASTS:  Exam is deferred.  ABDOMEN:  Obese, positive bowel sounds.  Abdomen is tender in the  epigastrium and right upper quadrant.  No appreciable mass or organomegaly.  EXTREMITIES:  No edema.   ADMITTING IMPRESSION:  [name] is a pleasant, 45 year old lady admitted  to the hospital with the acute onset of nausea, vomiting and some epigastric  pain.   She is feeling better today.  Due to the acute onset of her symptoms one  would wonder about a toxin mediated food borne illness, a viral syndrome is  not excluded.  She has chronic GERD, but I do not get a sense that this is  anything like an exacerbation of her chronic GERD symptoms.   Likewise there is nothing to go with hepatobiliary disease or pancreatitis  either.   I doubt peptic ulcer disease.   Given her history of gestational diabetes, it is certainly possible that she  could have an element of gastroparesis unmasked by an acute illness  compounding her symptoms of nausea and vomiting.   RECOMMENDATIONS:  1.  We will go ahead and let her have a clear liquid diet.  We will give her      Reglan 5 mg IV q.6h. x4 doses.  2.  Continue her symptomatic treatment including scheduled PPI therapy.  3.  Would hold off on an EGD and will reevaluate her tomorrow morning.      Hopefully her outlook is excellent.   I would like to thank Dr. Sherwood Gambler for letting me see this nice lady today.     RMR/MEDQ  D:  01/10/2005  T:  01/10/2005  Job:  [id number]   cc:   Madelin Rear. Sherwood Gambler, MD  [address]  [place]  [place] [address]  Fax: [phone]

## 2011-04-13 NOTE — Consult Note (Signed)
[name], [name]               ACCOUNT NO.:  [id number]   MEDICAL RECORD NO.:  [id number]           PATIENT TYPE:  AMB   LOCATION:                                FACILITY:  [facility]   PHYSICIAN:  Lionel December, M.D.    DATE OF BIRTH:  12-Nov-1966   DATE OF CONSULTATION:  01/29/2007  DATE OF DISCHARGE:                                 CONSULTATION   REFERRING PHYSICIAN:  Dr. Dionne Ano. McGough.   PRESENTING COMPLAINT:  Heartburn refractive to therapy.   HISTORY OF PRESENT ILLNESS:  Mellissa Donaldson is a 45 year old Caucasian femalemale  who is referred through the courtesy of Dr. Regino Schultze for a GI evaluation.  She has at least a 4-year history of heartburn which started during her  pregnancy.  Ever since, she has been on a PPI.  She states Nexium has  worked very well, until about 4 months ago.  Now she has nocturnal  symptoms.  She generally starts with a tickle in her throat,  regurgitation, heartburn and then she has vomiting spells.  These  symptoms do not occur every day, but a couple of times a week and  certain foods almost always induce these symptoms.  She denies  dysphagia.  She has had a very good appetite and she has gained about 10  pounds in 2 years.  She also complains of postprandial diarrhea and  bowel urgency; this generally occurs after breakfast and lunch and  usually not after evening meals.  She denies melena or rectal bleeding.  She was given Levsin and Reglan by Dr. Regino Schultze.  She developed dizziness  and she discontinued both these medicines.  She has never undergone EGD.  She states she has taken Prevacid and Prilosec in the past, but they  have not worked.  She denies hematemesis, melena or rectal bleeding.   MEDICATIONS:  1. Nexium 40 mg q.a.m.  2. Cleocin 150 mg t.i.d., begun yesterday for dental problems.  3. Hydrocodone with APAP 5/500 q.6 h. p.r.n.   PAST MEDICAL HISTORY:  1. Chronic GERD, as above.  2. She saw her dentist yesterday for toothache and was begun  and      Cleocin and hydrocodone.  3. She had a C-section in 2004.  4. Laparoscopic cholecystectomy in February 2006 when she was      hospitalized and seen in consultation by Dr. Jena Gauss.  5. Her prior consultation in our office was in March 2004 for      heartburn.  6. Finally, she had EGD back in November 1990 for epigastric pain,      nausea, vomiting and her EGD was normal.  It is interesting that      her ultrasound and OCG at that time were also normal.   ALLERGIES:  PENICILLIN.   FAMILY HISTORY:  Both parents and 2 sisters are in good health.   SOCIAL HISTORY:  She is married.  She has a 34-year-old son.  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  She has never smoked cigarettes and drinks alcohol  occasionally.  PHYSICAL EXAM:  GENERAL:  A pleasant, mildly obese Caucasian female who  is in no acute distress.  VITAL SIGNS:  She weighs 206-1/2 pound.  She is 5-feet 2-inches tall.  Pulse 84 per minute, blood pressure 122/86 and temperature is 98.3.  HEENT:  Conjunctivae are pink.  Sclerae are anicteric.  Oropharyngeal  mucosa is normal.  Dentition in satisfactory condition.  NECK:  No neck  masses or thyromegaly noted.  CARDIAC:  Regular rhythm.  Normal S1 and S2.  No murmur, rub or gallop  noted.  LUNGS:  Clear to auscultation.  ABDOMEN:  Full.  Bowel sounds are normal.  Palpation reveals soft  abdomen without tenderness, organomegaly or masses.  RECTAL:  Examination deferred.  EXTREMITIES:  No clubbing or edema noted.   ASSESSMENT:  1. Chronic gastroesophageal reflux disease.  Deanne has symptoms of      uncomplicated gastroesophageal reflux disease, although she has      stopped responding to Nexium, which works quite well for several      months; this may be secondary to a gradual weight gain or even      tachyphylaxis, which is not supposed to occur with proton pump      inhibitors, but is seen in some patients on  chronic therapy.  She      has had chronic symptoms and her upper gastrointestinal tract needs      to be evaluated to make sure she does not have Barrett's esophagus      or a large hiatal hernia.  2. Irritable bowel syndrome.   RECOMMENDATIONS:  1. Antiflux measures are reinforced.  2. Hemoccult x1.  3. Discontinue Nexium and start Zegerid at 40 mg q.a.m., 2-week supply      given.  4. Dicyclomine 10 mg before breakfast and before lunch, prescription      given for 60 with 3 refills.   Diagnostic esophagogastroduodenoscopy to be performed at [facility] within the  next 2 weeks.  I have reviewed the procedure and procedure risks with  the patient and she is agreeable.   We would like to thank Dr. Regino Schultze for the opportunity to participate in  the care of this nice lady.      Lionel December, M.D.  Electronically Signed     NR/MEDQ  D:  01/29/2007  T:  01/30/2007  Job:  [id number]   cc:   Kirk Ruths, M.D.  Fax: [phone]

## 2011-04-13 NOTE — Op Note (Signed)
NAMEARNETTE, DRIGGS               ACCOUNT NO.:  [id number]   MEDICAL RECORD NO.:  [id number]          PATIENT TYPE:  AMB   LOCATION:  DAY                           FACILITY:  [facility]   PHYSICIAN:  Lionel December, M.D.    DATE OF BIRTH:  [birth year]   DATE OF PROCEDURE:  02/17/2007  DATE OF DISCHARGE:                               OPERATIVE REPORT   PROCEDURE:  Esophagogastroduodenoscopy.   INDICATIONS:  [name] is 45 year old Caucasian female with chronic GERD  who is not responding to Nexium anymore.  She has tried Designer, fashion/clothing and  Prevacid which did not work.  When she was recently seen in the office  she was given Zegerid and this also not working.  She is undergoing EGD  to make sure she does not have a large hernia or Barrett's esophagus.  Procedure risks were reviewed the patient, informed consent was  obtained.   MEDS FOR CONSCIOUS SEDATION:  Benzocaine spray for pharyngeal topical  anesthesia, Demerol 50 mg IV, Versed 5 mg IV.   FINDINGS:  Procedure performed in endoscopy suite.  The patient's vital  signs and O2 sat were monitored during procedure and remained stable.  The patient was placed in left lateral position and Pentax videoscope  was passed via oropharynx without any difficulty into esophagus.   Esophagus. Mucosa of the esophagus was normal.  The GE junction was at  32 cm from the incisors and was unremarkable.  Hiatus was at 35.  Mucosa  of the herniated part of the stomach was normal.   Stomach was empty and distended very well with insufflation.  Folds of  proximal stomach were normal.  Examination mucosa revealed erythema and  some edema to prepyloric folds, but no erosions or ulcers were noted.  Pyloric channel was patent.  Angularis, fundus and cardia examined by  retroflexing the scope and were normal.   Duodenum bulbar mucosa was normal.  Scope was passed in second part of  the duodenum where mucosa and folds were normal.  Endoscope was  withdrawn.  The  patient tolerated the procedure well.   Please note that our system was not working and the pictures were taken  using digital camera directly from the screen.   FINAL DIAGNOSIS:  No evidence of Barrett's esophagus.  Small sliding  hiatal hernia.  Nonerosive antral gastritis.   RECOMMENDATIONS:  1. Antireflux measures reinforced.  2. Nexium 40 mg p.o. b.i.d. prescription given for 60 with two      refills.  3. H pylori serology be checked today.  4. She will return for OV in 8 weeks.      Lionel December, M.D.  Electronically Signed     NR/MEDQ  D:  02/17/2007  T:  02/17/2007  Job:  [id number]   cc:   Kirk Ruths, M.D.  Fax: [phone]

## 2011-04-13 NOTE — Discharge Summary (Signed)
NAME:  [name], [name]                         ACCOUNT NO.:  [id number]   MEDICAL RECORD NO.:  [id number]                   PATIENT TYPE:  INP   LOCATION:  9146                                 FACILITY:  [facility]   PHYSICIAN:  Timothy P. Fontaine, M.D.           DATE OF BIRTH:  03-23-1966   DATE OF ADMISSION:  11/11/2003  DATE OF DISCHARGE:  11/15/2003                                 DISCHARGE SUMMARY   DISCHARGE DIAGNOSES:  1. Intrauterine pregnancy at 39 weeks delivered.  2. Gestational diabetes on insulin.  3. Nonreassuring fetal heart tracing.  4. Leiomyomata.  5. Status post primary low transverse cervical cesarean section  by Dr.     Colin Broach on November 12, 2003.   HISTORY:  This is a 37-years-of-age female gravida 1 para 0 with an EDC of  November 17, 2003.  Prenatal course had been complicated by 37-years-of-age  at birth, declined amniocentesis.  Also developed gestational diabetes which  was insulin dependent and was followed with antepartum testing.   HOSPITAL COURSE:  On November 11, 2003 the patient was admitted for  induction at 39 weeks.  Cervidil was placed.  Pitocin was begun on November 12, 2003.  However, the patient developed a nonreassuring fetal heart rate  tracing, therefore underwent a primary low transverse cervical cesarean  section with Dr. Colin Broach on November 12, 2003 and underwent the  delivery of a female, Apgars of 9 and 9, weight of 5 pounds 12 ounces.  It was  also noted there was a posterior mid subserosal leiomyomata measuring 2 cm.  Postoperatively the patient remained afebrile, voiding, in stable condition.  She was discharged to home on November 15, 2003 and given [facility]  Gynecology postpartum instructions and postpartum booklet.   ACCESSORY CLINICAL FINDINGS/LABORATORY DATA:  The patient is A positive,  rubella immune.  On November 13, 2003 hemoglobin was 11.   DISPOSITION:  1. The patient was discharged to home.  2. Follow  up in 6 weeks.  3. Given a prescription for Tylox p.r.n. pain.     Susa Loffler, P.A.                    Timothy P. Audie Box, M.D.    Ardath Sax  D:  12/06/2003  T:  12/06/2003  Job:  [id number]

## 2011-05-17 ENCOUNTER — Other Ambulatory Visit: Payer: Self-pay | Admitting: Gynecology

## 2011-05-17 DIAGNOSIS — Z1231 Encounter for screening mammogram for malignant neoplasm of breast: Secondary | ICD-10-CM

## 2011-05-23 ENCOUNTER — Encounter: Payer: Self-pay | Admitting: Gynecology

## 2011-05-29 ENCOUNTER — Other Ambulatory Visit (HOSPITAL_COMMUNITY)
Admission: RE | Admit: 2011-05-29 | Discharge: 2011-05-29 | Disposition: A | Discharge status: Home / Self Care | Payer: BC Managed Care – PPO | Source: Ambulatory Visit | Attending: Gynecology | Admitting: Gynecology

## 2011-05-29 ENCOUNTER — Other Ambulatory Visit: Payer: Self-pay | Admitting: Gynecology

## 2011-05-29 ENCOUNTER — Encounter (INDEPENDENT_AMBULATORY_CARE_PROVIDER_SITE_OTHER): Payer: BC Managed Care – PPO | Admitting: Gynecology

## 2011-05-29 DIAGNOSIS — Z13220 Encounter for screening for lipoid disorders: Secondary | ICD-10-CM

## 2011-05-29 DIAGNOSIS — Z124 Encounter for screening for malignant neoplasm of cervix: Secondary | ICD-10-CM | POA: Insufficient documentation

## 2011-05-29 DIAGNOSIS — R82998 Other abnormal findings in urine: Secondary | ICD-10-CM

## 2011-05-29 DIAGNOSIS — Z01419 Encounter for gynecological examination (general) (routine) without abnormal findings: Secondary | ICD-10-CM

## 2011-05-29 DIAGNOSIS — Z833 Family history of diabetes mellitus: Secondary | ICD-10-CM

## 2011-05-29 DIAGNOSIS — N951 Menopausal and female climacteric states: Secondary | ICD-10-CM

## 2011-06-04 ENCOUNTER — Encounter: Payer: Self-pay | Admitting: Anesthesiology

## 2011-06-07 ENCOUNTER — Ambulatory Visit (HOSPITAL_COMMUNITY)
Admission: RE | Admit: 2011-06-07 | Discharge: 2011-06-07 | Disposition: A | Discharge status: Home / Self Care | Payer: BC Managed Care – PPO | Source: Ambulatory Visit | Attending: Gynecology | Admitting: Gynecology

## 2011-06-07 DIAGNOSIS — Z1231 Encounter for screening mammogram for malignant neoplasm of breast: Secondary | ICD-10-CM | POA: Insufficient documentation

## 2011-08-16 LAB — URINE MICROSCOPIC-ADD ON

## 2011-08-16 LAB — URINALYSIS, ROUTINE W REFLEX MICROSCOPIC
Glucose, UA: NEGATIVE
Hgb urine dipstick: NEGATIVE
Leukocytes, UA: NEGATIVE
Nitrite: NEGATIVE
Protein, ur: 100 — AB
Specific Gravity, Urine: >1.03 — ABNORMAL HIGH
Urobilinogen, UA: 1
pH: 5

## 2011-08-16 LAB — DIFFERENTIAL
Basophils Absolute: 0
Basophils Relative: 0
Eosinophils Absolute: 0
Neutrophils Relative %: 89 — ABNORMAL HIGH

## 2011-08-16 LAB — PREGNANCY, URINE: Preg Test, Ur: NEGATIVE

## 2011-08-16 LAB — COMPREHENSIVE METABOLIC PANEL WITH GFR
ALT: 28
AST: 21
Albumin: 3.3 — ABNORMAL LOW
Alkaline Phosphatase: 95
BUN: 9
CO2: 27
Calcium: 8.8
Chloride: 97
Creatinine, Ser: 0.76
GFR calc Af Amer: >60
GFR calc non Af Amer: >60
Glucose, Bld: 137 — ABNORMAL HIGH
Potassium: 3 — ABNORMAL LOW
Sodium: 134 — ABNORMAL LOW
Total Bilirubin: 0.6
Total Protein: 7.5

## 2011-08-16 LAB — CBC
Hemoglobin: 14.1
MCHC: 34
RBC: 4.67

## 2012-05-21 ENCOUNTER — Other Ambulatory Visit: Payer: Self-pay | Admitting: Gynecology

## 2012-05-21 DIAGNOSIS — Z1231 Encounter for screening mammogram for malignant neoplasm of breast: Secondary | ICD-10-CM

## 2012-06-02 ENCOUNTER — Ambulatory Visit (INDEPENDENT_AMBULATORY_CARE_PROVIDER_SITE_OTHER): Payer: BC Managed Care – PPO | Admitting: Gynecology

## 2012-06-02 ENCOUNTER — Encounter: Payer: Self-pay | Admitting: Gynecology

## 2012-06-02 VITALS — BP 128/88 | Ht 62.0 in | Wt 213.0 lb

## 2012-06-02 DIAGNOSIS — Z13220 Encounter for screening for lipoid disorders: Secondary | ICD-10-CM

## 2012-06-02 DIAGNOSIS — Z131 Encounter for screening for diabetes mellitus: Secondary | ICD-10-CM

## 2012-06-02 DIAGNOSIS — Z01419 Encounter for gynecological examination (general) (routine) without abnormal findings: Secondary | ICD-10-CM

## 2012-06-02 DIAGNOSIS — R635 Abnormal weight gain: Secondary | ICD-10-CM

## 2012-06-02 LAB — LIPID PANEL
Cholesterol: 150 mg/dL (ref 0–200)
HDL: 42 mg/dL (ref >39)
LDL Cholesterol: 85 mg/dL (ref 0–99)
Total CHOL/HDL Ratio: 3.6 Ratio
Triglycerides: 114 mg/dL (ref <150)
VLDL: 23 mg/dL (ref 0–40)

## 2012-06-02 NOTE — Progress Notes (Signed)
[name] [birth year] [id number]        46 y.o.  for annual exam.  Overall doing well.  Past medical history,surgical history, medications, allergies, family history and social history were all reviewed and documented in the EPIC chart. ROS:  Was performed and pertinent positives and negatives are included in the history.  Exam: Sherrilyn Rist assistant Filed Vitals:   06/02/12 1042  BP: 128/88   General appearance  Normal Skin grossly normal Head/Neck normal with no cervical or supraclavicular adenopathy thyroid normal Lungs  clear Cardiac RR, without RMG Abdominal  soft, nontender, without masses, organomegaly or hernia Breasts  examined lying and sitting without masses, retractions, discharge or axillary adenopathy. Pelvic  Ext/BUS/vagina  normal   Cervix  normal   Uterus  anteverted, normal size, shape and contour, midline and mobile nontender   Adnexa  Without masses or tenderness    Anus and perineum  normal   Rectovaginal  normal sphincter tone without palpated masses or tenderness.    Assessment/Plan:  46 y.o. female for annual exam, vasectomy birth control.    1. Mild menstrual irregularity. Menses usually monthly with some variation as to when they occur. No intermenstrual bleeding. A fair amount of weight gain this past year due to lack of exercise and dietary indiscretion. Patient starting an exercise program now. Check TSH otherwise keep menstrual calendar as long as once a month and will follow. 2. Pap smear. 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 Patient has a number of years of normal Pap smears in her chart with last Pap smear 2012. No Pap done today. Will plan less frequent screening every 3-5 years per current screening guidelines. 3. Mammography. Patient has scheduled end of the month. We'll continue with annual mammography. SBE monthly reviewed. 4. Mild diastolic hypertension.  Blood pressure 128/88. Patient relates  this to rushing around this morning. Have asked her to have it rechecked in a non-exam situation. As long as it's in the 120/70 range will monitor. If it remains elevated she knows to let me know. 5. Health maintenance. Baseline CBC, lipid profile, glucose and urinalysis ordered. Assuming she continues well from a gynecologic standpoint she will follow up in one year, sooner as needed.    Dara Lords MD, 11:19 AM 06/02/2012

## 2012-06-02 NOTE — Patient Instructions (Signed)
Office will contact you with lab results.  Begin exercise and diet program as we discussed. Recheck blood pressure. If it remains elevated me know. Follow up in one year for annual gynecologic exam.

## 2012-06-03 LAB — URINALYSIS W MICROSCOPIC + REFLEX CULTURE
Bacteria, UA: NONE SEEN
Bilirubin Urine: NEGATIVE
Crystals: NONE SEEN
Hgb urine dipstick: NEGATIVE
Ketones, ur: NEGATIVE mg/dL
Nitrite: NEGATIVE
Protein, ur: NEGATIVE mg/dL
Specific Gravity, Urine: 1.024 (ref 1.005–1.030)
Urobilinogen, UA: 0.2 mg/dL (ref 0.0–1.0)

## 2012-06-03 LAB — CBC WITH DIFFERENTIAL/PLATELET
Eosinophils Relative: 4 % (ref 0–5)
HCT: 44.9 % (ref 36.0–46.0)
Hemoglobin: 14.3 g/dL (ref 12.0–15.0)
Lymphocytes Relative: 37 % (ref 12–46)
Lymphs Abs: 3.2 10*3/uL (ref 0.7–4.0)
MCV: 91.8 fL (ref 78.0–100.0)
Monocytes Absolute: 0.5 10*3/uL (ref 0.1–1.0)
Monocytes Relative: 6 % (ref 3–12)
Neutro Abs: 4.5 10*3/uL (ref 1.7–7.7)
WBC: 8.5 10*3/uL (ref 4.0–10.5)

## 2012-06-03 LAB — TSH: TSH: 2.643 u[IU]/mL (ref 0.350–4.500)

## 2012-06-19 ENCOUNTER — Ambulatory Visit (HOSPITAL_COMMUNITY): Payer: BC Managed Care – PPO

## 2012-06-25 ENCOUNTER — Ambulatory Visit (HOSPITAL_COMMUNITY)
Admission: RE | Admit: 2012-06-25 | Discharge: 2012-06-25 | Disposition: A | Discharge status: Home / Self Care | Payer: BC Managed Care – PPO | Source: Ambulatory Visit | Attending: Gynecology | Admitting: Gynecology

## 2012-06-25 DIAGNOSIS — Z1231 Encounter for screening mammogram for malignant neoplasm of breast: Secondary | ICD-10-CM | POA: Insufficient documentation

## 2013-08-19 ENCOUNTER — Ambulatory Visit (INDEPENDENT_AMBULATORY_CARE_PROVIDER_SITE_OTHER): Payer: BC Managed Care – PPO | Admitting: Gastroenterology

## 2013-08-19 ENCOUNTER — Encounter: Payer: Self-pay | Admitting: Gastroenterology

## 2013-08-19 ENCOUNTER — Ambulatory Visit: Payer: BC Managed Care – PPO | Admitting: Gastroenterology

## 2013-08-19 VITALS — BP 123/80 | HR 75 | Temp 98.4°F | Ht 62.0 in | Wt 202.6 lb

## 2013-08-19 DIAGNOSIS — R197 Diarrhea, unspecified: Secondary | ICD-10-CM

## 2013-08-19 MED ORDER — COLESTIPOL HCL 1 G PO TABS: ORAL_TABLET | ORAL | Status: DC

## 2013-08-19 NOTE — Progress Notes (Signed)
Reminder in epic °

## 2013-08-19 NOTE — Progress Notes (Signed)
Subjective:    Patient ID: [name], female    DOB: Jan 05, 1966, 47 y.o.   MRN: [id number]  PCP: GOLDING  HPI POSTPRANDIAL LOOSE STOOLS ARE OUT OF CONTROL. [place]EN [place] [id number]. HAD BEEN RX LEVSIN OR COLESTID. CAN'T REMEMBER WHICH MED CAUSED HER CONSTIPATION AND ABD PAIN. SHE WOULD NOT GO. FEELS THE URGE LOWER ABD MILD AND THEN MORE SEVERE. HAS AN ACCIDENT ON A DEPT STORE RECENTLY. DAIRY: RARE ICE CREAM, CHEESE: OCCASIONALLY. TRIED A PROBIOTIC SINCE JUL. NOT TAKING CITRUCEL. CITRUCEL MADE HER GOT THE BR MORE. PT DENIES FEVER, CHILLS, BRBPR, nausea, vomiting, melena,  problems swallowing, OR heartburn or indigestion. NEXIUM WORKS 100% BUT IF SHE MISSES A DOSE SHE HAS HEARTBURN. GAVE UP SODAS. STILL DRINKING CAFFEINATED COFFEE WITH CREAMER(< 1 CU).  Past Medical History  Diagnosis Date  . Irritable bowel syndrome   . GERD (gastroesophageal reflux disease)   . HTN (hypertension)    Past Surgical History  Procedure Laterality Date  . Gynecologic cryosurgery    . Cesarean section  10/2003  . Cholecystectomy  2006  . Cervical cone biopsy  1989    CIN 1, ECC positive   Allergies  Allergen Reactions  . Penicillins    Current Outpatient Prescriptions  Medication Sig Dispense Refill  . esomeprazole (NEXIUM) 20 MG capsule Take 20 mg by mouth daily before breakfast.      . lisinopril (PRINIVIL,ZESTRIL) 10 MG tablet Take 10 mg by mouth daily.       Marland Kitchen loratadine (CLARITIN) 10 MG tablet Take 10 mg by mouth daily.      . sertraline (ZOLOFT) 50 MG tablet Take 50 mg by mouth daily.        Family History  Problem Relation Age of Onset  . Hypertension Father   . Heart attack Father   . Hypertension Sister   . Hypertension Mother   . Colon polyps Neg Hx   . Colon cancer Neg Hx     History   Social History  . Marital Status: Married    Spouse Name: N/A    Number of Children: N/A  . Years of Education: N/A   Occupational History  . DRIVES A SCHOOL   Social History Main Topics  .  Smoking status: Never Smoker   . Smokeless tobacco: Never Used  . Alcohol Use: Yes     Comment: socially  . Drug Use: No  . Sexual Activity: Yes    Birth Control/ Protection: Other-see comments     Comment: vasectomy   Review of Systems PER HPI OTHERWISE ALL SYSTEMS ARE NEGATIVE.     Objective:   Physical Exam  Vitals reviewed. Constitutional: She is oriented to person, place, and time. She appears well-nourished. No distress.  HENT:  Head: Normocephalic and atraumatic.  Mouth/Throat: Oropharynx is clear and moist. No oropharyngeal exudate.  Eyes: Pupils are equal, round, and reactive to light. No scleral icterus.  Neck: Normal range of motion. Neck supple.  Cardiovascular: Normal rate, regular rhythm and normal heart sounds.   Pulmonary/Chest: Effort normal and breath sounds normal. No respiratory distress.  Abdominal: Soft. Bowel sounds are normal. She exhibits no distension. There is tenderness. There is no rebound and no guarding.  MILD BLQs TTP   Musculoskeletal: Normal range of motion. She exhibits no edema.  Lymphadenopathy:    She has no cervical adenopathy.  Neurological: She is alert and oriented to person, place, and time.  NO FOCAL DEFICITS   Psychiatric: She has a normal mood  and affect.          Assessment & Plan:

## 2013-08-19 NOTE — Patient Instructions (Signed)
START COLESTID BEFORE DINNER. YOU MAY USE ONE BEFORE BREAKFAST OR LUNCH IF YOUR DIARRHEA IS NOT IDEALLY CONTROLLED.  CONTINUE YOUR WEIGHT LOSS EFFORTS.  FOLLOW A LOW FAT DIET. SEE INFO BELOW.  CALL DR. Mauricio Dahlen IN 2 WEEKS WITH A PROGRESS REPORT.  FOLLOW UP IN 2 MOS.    Low-Fat Diet BREADS, CEREALS, PASTA, RICE, DRIED PEAS, AND BEANS These products are high in carbohydrates and most are low in fat. Therefore, they can be increased in the diet as substitutes for fatty foods. They too, however, contain calories and should not be eaten in excess. Cereals can be eaten for snacks as well as for breakfast.   FRUITS AND VEGETABLES It is good to eat fruits and vegetables. Besides being sources of fiber, both are rich in vitamins and some minerals. They help you get the daily allowances of these nutrients. Fruits and vegetables can be used for snacks and desserts.  MEATS Limit lean meat, chicken, [place], and fish to no more than 6 ounces per day. Beef, Pork, and Lamb Use lean cuts of beef, pork, and lamb. Lean cuts include:  Extra-lean ground beef.  Arm roast.  Sirloin tip.  Center-cut ham.  Round steak.  Loin chops.  Rump roast.  Tenderloin.  Trim all fat off the outside of meats before cooking. It is not necessary to severely decrease the intake of red meat, but lean choices should be made. Lean meat is rich in protein and contains a highly absorbable form of iron. Premenopausal women, in particular, should avoid reducing lean red meat because this could increase the risk for low red blood cells (iron-deficiency anemia).  Chicken and [place] These are good sources of protein. The fat of poultry can be reduced by removing the skin and underlying fat layers before cooking. Chicken and [place] can be substituted for lean red meat in the diet. Poultry should not be fried or covered with high-fat sauces. Fish and Shellfish Fish is a good source of protein. Shellfish contain cholesterol, but they  usually are low in saturated fatty acids. The preparation of fish is important. Like chicken and [place], they should not be fried or covered with high-fat sauces. EGGS Egg whites contain no fat or cholesterol. They can be eaten often. Try 1 to 2 egg whites instead of whole eggs in recipes or use egg substitutes that do not contain yolk. MILK AND DAIRY PRODUCTS Use skim or 1% milk instead of 2% or whole milk. Decrease whole milk, natural, and processed cheeses. Use nonfat or low-fat (2%) cottage cheese or low-fat cheeses made from vegetable oils. Choose nonfat or low-fat (1 to 2%) yogurt. Experiment with evaporated skim milk in recipes that call for heavy cream. Substitute low-fat yogurt or low-fat cottage cheese for sour cream in dips and salad dressings. Have at least 2 servings of low-fat dairy products, such as 2 glasses of skim (or 1%) milk each day to help get your daily calcium intake. FATS AND OILS Reduce the total intake of fats, especially saturated fat. Butterfat, lard, and beef fats are high in saturated fat and cholesterol. These should be avoided as much as possible. Vegetable fats do not contain cholesterol, but certain vegetable fats, such as coconut oil, palm oil, and palm kernel oil are very high in saturated fats. These should be limited. These fats are often used in bakery goods, processed foods, popcorn, oils, and nondairy creamers. Vegetable shortenings and some peanut butters contain hydrogenated oils, which are also saturated fats. Read the labels on  these foods and check for saturated vegetable oils. Unsaturated vegetable oils and fats do not raise blood cholesterol. However, they should be limited because they are fats and are high in calories. Total fat should still be limited to 30% of your daily caloric intake. Desirable liquid vegetable oils are corn oil, cottonseed oil, olive oil, canola oil, safflower oil, soybean oil, and sunflower oil. Peanut oil is not as good, but small  amounts are acceptable. Buy a heart-healthy tub margarine that has no partially hydrogenated oils in the ingredients. Mayonnaise and salad dressings often are made from unsaturated fats, but they should also be limited because of their high calorie and fat content. Seeds, nuts, peanut butter, olives, and avocados are high in fat, but the fat is mainly the unsaturated type. These foods should be limited mainly to avoid excess calories and fat. OTHER EATING TIPS Snacks  Most sweets should be limited as snacks. They tend to be rich in calories and fats, and their caloric content outweighs their nutritional value. Some good choices in snacks are graham crackers, melba toast, soda crackers, bagels (no egg), English muffins, fruits, and vegetables. These snacks are preferable to snack crackers, [place] fries, TORTILLA CHIPS, and POTATO chips. Popcorn should be air-popped or cooked in small amounts of liquid vegetable oil. Desserts Eat fruit, low-fat yogurt, and fruit ices instead of pastries, cake, and cookies. Sherbet, angel food cake, gelatin dessert, frozen low-fat yogurt, or other frozen products that do not contain saturated fat (pure fruit juice bars, frozen ice pops) are also acceptable.  COOKING METHODS Choose those methods that use little or no fat. They include: Poaching.  Braising.  Steaming.  Grilling.  Baking.  Stir-frying.  Broiling.  Microwaving.  Foods can be cooked in a nonstick pan without added fat, or use a nonfat cooking spray in regular cookware. Limit fried foods and avoid frying in saturated fat. Add moisture to lean meats by using water, broth, cooking wines, and other nonfat or low-fat sauces along with the cooking methods mentioned above. Soups and stews should be chilled after cooking. The fat that forms on top after a few hours in the refrigerator should be skimmed off. When preparing meals, avoid using excess salt. Salt can contribute to raising blood pressure in some  people.  EATING AWAY FROM HOME Order entres, potatoes, and vegetables without sauces or butter. When meat exceeds the size of a deck of cards (3 to 4 ounces), the rest can be taken home for another meal. Choose vegetable or fruit salads and ask for low-calorie salad dressings to be served on the side. Use dressings sparingly. Limit high-fat toppings, such as bacon, crumbled eggs, cheese, sunflower seeds, and olives. Ask for heart-healthy tub margarine instead of butter.

## 2013-08-19 NOTE — Assessment & Plan Note (Signed)
MOST LIKELY DUE TO BILE SALT INDUCED DIARRHEA, LESS LIKELY IBS. HAD CONSTIPATION/ABD PAIN WITH LEVSIN AND COLESTID.  START COLESTID BEFORE DINNER. MAY USE BEFORE BREAKFAST OR LUNCH IF DIARRHEA NOT IDEALLY CONTROLLED. CONTINUE YOUR WEIGHT LOSS EFFORTS. LOW FAT DIET CALL IN 2 WEEKS WITH A PROGRESS REPORT OPV IN 2 MOS

## 2013-08-20 ENCOUNTER — Ambulatory Visit: Payer: BC Managed Care – PPO | Admitting: Gastroenterology

## 2013-08-20 NOTE — Progress Notes (Signed)
CC'd to PCP 

## 2013-08-24 ENCOUNTER — Ambulatory Visit: Payer: BC Managed Care – PPO | Admitting: Gastroenterology

## 2013-10-05 ENCOUNTER — Other Ambulatory Visit: Payer: Self-pay | Admitting: Cardiovascular Disease

## 2013-11-30 ENCOUNTER — Other Ambulatory Visit: Payer: Self-pay | Admitting: Gynecology

## 2013-11-30 DIAGNOSIS — Z1231 Encounter for screening mammogram for malignant neoplasm of breast: Secondary | ICD-10-CM

## 2013-12-03 ENCOUNTER — Encounter: Payer: Self-pay | Admitting: Gynecology

## 2013-12-03 ENCOUNTER — Ambulatory Visit (INDEPENDENT_AMBULATORY_CARE_PROVIDER_SITE_OTHER): Payer: BC Managed Care – PPO | Admitting: Gynecology

## 2013-12-03 ENCOUNTER — Other Ambulatory Visit (HOSPITAL_COMMUNITY)
Admission: RE | Admit: 2013-12-03 | Discharge: 2013-12-03 | Disposition: A | Discharge status: Home / Self Care | Payer: BC Managed Care – PPO | Source: Ambulatory Visit | Attending: Gynecology | Admitting: Gynecology

## 2013-12-03 VITALS — BP 120/76 | Ht 62.0 in | Wt 201.0 lb

## 2013-12-03 DIAGNOSIS — Z01419 Encounter for gynecological examination (general) (routine) without abnormal findings: Secondary | ICD-10-CM | POA: Insufficient documentation

## 2013-12-03 DIAGNOSIS — Z1151 Encounter for screening for human papillomavirus (HPV): Secondary | ICD-10-CM | POA: Insufficient documentation

## 2013-12-03 DIAGNOSIS — N951 Menopausal and female climacteric states: Secondary | ICD-10-CM

## 2013-12-03 DIAGNOSIS — N926 Irregular menstruation, unspecified: Secondary | ICD-10-CM

## 2013-12-03 LAB — CBC WITH DIFFERENTIAL/PLATELET
BASOS PCT: 0 % (ref 0–1)
Basophils Absolute: 0 10*3/uL (ref 0.0–0.1)
EOS ABS: 0.3 10*3/uL (ref 0.0–0.7)
EOS PCT: 3 % (ref 0–5)
HCT: 40.8 % (ref 36.0–46.0)
HEMOGLOBIN: 13.7 g/dL (ref 12.0–15.0)
LYMPHS ABS: 4.1 10*3/uL — AB (ref 0.7–4.0)
Lymphocytes Relative: 38 % (ref 12–46)
MCH: 29.2 pg (ref 26.0–34.0)
MCHC: 33.6 g/dL (ref 30.0–36.0)
MCV: 87 fL (ref 78.0–100.0)
MONO ABS: 0.7 10*3/uL (ref 0.1–1.0)
MONOS PCT: 7 % (ref 3–12)
NEUTROS PCT: 52 % (ref 43–77)
Neutro Abs: 5.5 10*3/uL (ref 1.7–7.7)
Platelets: 331 10*3/uL (ref 150–400)
RBC: 4.69 MIL/uL (ref 3.87–5.11)
RDW: 14.5 % (ref 11.5–15.5)
WBC: 10.7 10*3/uL — ABNORMAL HIGH (ref 4.0–10.5)

## 2013-12-03 NOTE — Patient Instructions (Signed)
Followup for blood test results. Keep a menstrual calendar. As long as less frequent but regular menses and we will monitor. If prolonged or atypical bleeding or you go more than one year and have bleeding I need to know this. If significant menopausal symptoms such as hot flushes night sweats or other symptoms develop and you want to discuss hormone replacement therapy then make another appointment to do so. Followup in one year for annual exam.

## 2013-12-03 NOTE — Progress Notes (Signed)
[name] [birth year] [id number]        48 y.o.  G1P1 for annual exam.  She is overdue noting last exam 05/2012. Several issues are noted below.  Past medical history,surgical history, problem list, medications, allergies, family history and social history were all reviewed and documented in the EPIC chart.  ROS:  Performed and pertinent positives and negatives are included in the history, assessment and plan .  Exam: Kim assistant Filed Vitals:   12/03/13 1557  BP: 120/76  Height: 5\' 2"  (1.575 m)  Weight: 201 lb (91.173 kg)   General appearance  Normal Skin grossly normal Head/Neck normal with no cervical or supraclavicular adenopathy thyroid normal Lungs  clear Cardiac RR, without RMG Abdominal  soft, nontender, without masses, organomegaly or hernia Breasts  examined lying and sitting without masses, retractions, discharge or axillary adenopathy. Pelvic  Ext/BUS/vagina  Normal  Cervix  Normal with old scarring. Pap/HPV  Uterus  anteverted, normal size, shape and contour, midline and mobile nontender   Adnexa  Without masses or tenderness    Anus and perineum  Normal   Rectovaginal  Normal sphincter tone without palpated masses or tenderness.    Assessment/Plan:  48 y.o. G1P1 female for annual exam, irregular menses, vasectomy birth control.   1. Perimenopausal/irregular menses. Patient is having some menopausal symptoms including hot flushes and sweats as well as emotional swings. She is on Zoloft which helps with this. She tried stopping at one point but had a lot of anxiety and emotional swings. Was having regular menses through the beginning of this year and then stopped but had a followup menses in November and December. No prolonged bleeding or spotting. Will check baseline TSH FSH. Assuming consistent with perimenopause and will keep menstrual calendar and as long as last frequent regular menses we'll monitor. If prolonged or atypical bleeding and she knows to report to  [place]. If significant hot flashes or sweats but she wants to discuss HRT and she'll represent for discussion. 2. Pap 05/2011. Pap/HPV done today. History of prior surgery with subsequent cone biopsy 1989 with normal Pap smears since then. 3. Mammography scheduled for next week. Continue with annual mammography. SBE monthly review. 4. Health maintenance. She is being followed by a primary for hypertension but asked if I could do her basic blood work and a CBC comprehensive metabolic panel lipid profile urinalysis TSH FSH ordered.  Note: This document was prepared with digital dictation and possible smart phrase technology. Any transcriptional errors that result from this process are unintentional.   Anastasio Auerbach MD, 4:51 PM 12/03/2013

## 2013-12-04 LAB — LIPID PANEL
CHOLESTEROL: 156 mg/dL (ref 0–200)
HDL: 39 mg/dL — AB (ref >39)
LDL CALC: 81 mg/dL (ref 0–99)
TRIGLYCERIDES: 179 mg/dL — AB (ref <150)
Total CHOL/HDL Ratio: 4 Ratio
VLDL: 36 mg/dL (ref 0–40)

## 2013-12-04 LAB — URINALYSIS W MICROSCOPIC + REFLEX CULTURE
Bilirubin Urine: NEGATIVE
CASTS: NONE SEEN
GLUCOSE, UA: NEGATIVE mg/dL
HGB URINE DIPSTICK: NEGATIVE
KETONES UR: NEGATIVE mg/dL
LEUKOCYTES UA: NEGATIVE
Nitrite: NEGATIVE
PH: 5.5 (ref 5.0–8.0)
Protein, ur: NEGATIVE mg/dL
SPECIFIC GRAVITY, URINE: 1.022 (ref 1.005–1.030)
Urobilinogen, UA: 0.2 mg/dL (ref 0.0–1.0)

## 2013-12-04 LAB — FOLLICLE STIMULATING HORMONE: FSH: 6.4 m[IU]/mL

## 2013-12-04 LAB — COMPREHENSIVE METABOLIC PANEL
ALK PHOS: 89 U/L (ref 39–117)
ALT: 18 U/L (ref 0–35)
AST: 15 U/L (ref 0–37)
Albumin: 3.9 g/dL (ref 3.5–5.2)
BILIRUBIN TOTAL: 0.3 mg/dL (ref 0.3–1.2)
BUN: 11 mg/dL (ref 6–23)
CO2: 27 meq/L (ref 19–32)
CREATININE: 0.68 mg/dL (ref 0.50–1.10)
Calcium: 9.4 mg/dL (ref 8.4–10.5)
Chloride: 104 mEq/L (ref 96–112)
GLUCOSE: 87 mg/dL (ref 70–99)
Potassium: 3.8 mEq/L (ref 3.5–5.3)
SODIUM: 139 meq/L (ref 135–145)
Total Protein: 7 g/dL (ref 6.0–8.3)

## 2013-12-04 LAB — TSH: TSH: 1.991 u[IU]/mL (ref 0.350–4.500)

## 2013-12-06 LAB — URINE CULTURE

## 2013-12-07 ENCOUNTER — Ambulatory Visit (HOSPITAL_COMMUNITY)
Admission: RE | Admit: 2013-12-07 | Discharge: 2013-12-07 | Disposition: A | Discharge status: Home / Self Care | Payer: BC Managed Care – PPO | Source: Ambulatory Visit | Attending: Gynecology | Admitting: Gynecology

## 2013-12-07 ENCOUNTER — Other Ambulatory Visit: Payer: Self-pay | Admitting: Gynecology

## 2013-12-07 DIAGNOSIS — Z1231 Encounter for screening mammogram for malignant neoplasm of breast: Secondary | ICD-10-CM

## 2013-12-07 DIAGNOSIS — N912 Amenorrhea, unspecified: Secondary | ICD-10-CM

## 2013-12-07 DIAGNOSIS — N926 Irregular menstruation, unspecified: Secondary | ICD-10-CM

## 2013-12-07 MED ORDER — SULFAMETHOXAZOLE-TMP DS 800-160 MG PO TABS: 1.0000 | ORAL_TABLET | Freq: Two times a day (BID) | ORAL | Status: DC

## 2013-12-14 ENCOUNTER — Other Ambulatory Visit: Payer: Self-pay | Admitting: Gynecology

## 2013-12-14 DIAGNOSIS — N912 Amenorrhea, unspecified: Secondary | ICD-10-CM

## 2013-12-14 DIAGNOSIS — N926 Irregular menstruation, unspecified: Secondary | ICD-10-CM

## 2013-12-27 DIAGNOSIS — N870 Mild cervical dysplasia: Secondary | ICD-10-CM

## 2013-12-27 HISTORY — DX: Mild cervical dysplasia: N87.0

## 2013-12-28 ENCOUNTER — Other Ambulatory Visit: Payer: Self-pay | Admitting: Gynecology

## 2013-12-28 ENCOUNTER — Encounter: Payer: Self-pay | Admitting: Gynecology

## 2013-12-28 ENCOUNTER — Ambulatory Visit (INDEPENDENT_AMBULATORY_CARE_PROVIDER_SITE_OTHER): Payer: BC Managed Care – PPO | Admitting: Gynecology

## 2013-12-28 ENCOUNTER — Ambulatory Visit (INDEPENDENT_AMBULATORY_CARE_PROVIDER_SITE_OTHER): Payer: BC Managed Care – PPO

## 2013-12-28 DIAGNOSIS — N926 Irregular menstruation, unspecified: Secondary | ICD-10-CM

## 2013-12-28 DIAGNOSIS — N8300 Follicular cyst of ovary, unspecified side: Secondary | ICD-10-CM

## 2013-12-28 DIAGNOSIS — N938 Other specified abnormal uterine and vaginal bleeding: Secondary | ICD-10-CM

## 2013-12-28 DIAGNOSIS — N920 Excessive and frequent menstruation with regular cycle: Secondary | ICD-10-CM

## 2013-12-28 DIAGNOSIS — N912 Amenorrhea, unspecified: Secondary | ICD-10-CM

## 2013-12-28 DIAGNOSIS — N949 Unspecified condition associated with female genital organs and menstrual cycle: Secondary | ICD-10-CM

## 2013-12-28 DIAGNOSIS — N925 Other specified irregular menstruation: Secondary | ICD-10-CM

## 2013-12-28 NOTE — Patient Instructions (Signed)
Call if you have any prolonged or atypical bleeding. Call if you're menopausal symptoms become worse they want to discuss hormone replacement therapy. Followup in 1 year, sooner as needed.

## 2013-12-28 NOTE — Progress Notes (Signed)
Patient presents for a sonohysterogram. History of sporadic menses this past year. FSH TSH were normal. No prolonged or atypical bleeding.  Ultrasound shows uterus normal size and echotexture. Endometrial echo 3.8 mm. Right and left ovaries normal. Cul-de-sac negative.  Sonohysterogram performed, sterile technique, easy catheter introduction, good distention with no abnormalities. Endometrial sample taken. Patient tolerated well.  Assessment and plan: Perimenopausal with skipped menses. [place] normal. Ultrasound negative with thin endometrial echo. Sonohysterogram negative. Patient will followup for biopsy results. Assuming negative then plan expectant management. Call if any prolonged or atypical bleeding. Call if menopausal symptoms worsened and patient wants to discuss hormone replacement therapy. Otherwise followup in one year for annual exam.

## 2013-12-30 ENCOUNTER — Encounter: Payer: Self-pay | Admitting: Gynecology

## 2014-09-01 ENCOUNTER — Encounter: Payer: Self-pay | Admitting: Gastroenterology

## 2014-09-01 ENCOUNTER — Other Ambulatory Visit: Payer: Self-pay | Admitting: Gastroenterology

## 2014-09-01 NOTE — Telephone Encounter (Signed)
Due for nonurgent f/u ov.

## 2014-09-01 NOTE — Telephone Encounter (Signed)
APPT MADE AND LETTER SENT  °

## 2014-09-27 ENCOUNTER — Encounter: Payer: Self-pay | Admitting: Gynecology

## 2014-10-05 ENCOUNTER — Encounter: Payer: Self-pay | Admitting: Gastroenterology

## 2014-10-05 ENCOUNTER — Ambulatory Visit (INDEPENDENT_AMBULATORY_CARE_PROVIDER_SITE_OTHER): Payer: BC Managed Care – PPO | Admitting: Gastroenterology

## 2014-10-05 VITALS — BP 135/83 | HR 84 | Temp 97.2°F | Ht 62.0 in | Wt 209.0 lb

## 2014-10-05 DIAGNOSIS — R197 Diarrhea, unspecified: Secondary | ICD-10-CM

## 2014-10-05 DIAGNOSIS — K219 Gastro-esophageal reflux disease without esophagitis: Secondary | ICD-10-CM

## 2014-10-05 MED ORDER — COLESTIPOL HCL 1 G PO TABS: ORAL_TABLET | ORAL | Status: DC

## 2014-10-05 NOTE — Patient Instructions (Signed)
Continue taking Colestid daily to twice a day as you are doing.   Keep up the good work on the healthy lifestyle!  Have a great vacation!  We will see you back in 1 year.

## 2014-10-05 NOTE — Progress Notes (Signed)
Referring Provider: Elsie Lincoln, MD Primary Care Physician:  Leonides Grills, MD  Primary GI: Dr. Oneida Alar   Chief Complaint  Patient presents with  . 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 Last seen Sept 2014 by Dr. Oneida Alar. Colestid in the morning, not at night. Has 3 loose stools in the morning. Then usually good the rest of the day. If doesn't take it and eats lunch, has unpredictable outcome. No abdominal pain. Taking Nexium OTC, works most of the time. Every once in awhile will have breakthrough symptoms. Last Friday had vomiting due to severe reflux. Colonoscopy due 2019.   Past Medical History  Diagnosis Date  . Irritable bowel syndrome   . GERD (gastroesophageal reflux disease)   . HTN (hypertension)   . CIN I (cervical intraepithelial neoplasia I)   . Cervical atypia, mild 12/2013    Sonohysterogram biopsy showed benign endometrium but inflamed mild atypia and squamous mucosa fragments recommend repeat Pap smear 6 months    Past Surgical History  Procedure Laterality Date  . Gynecologic cryosurgery    . Cesarean section  10/2003  . Cholecystectomy  2006  . Cervical cone biopsy  1989    CIN 1, ECC positive  . Colonoscopy  2009    Dr. Oneida Alar: frequent sigmoid colon diverticula, small internal hemrorhoids, colonic biopsies to assess for microscopic colitis, Benign     Current Outpatient Prescriptions  Medication Sig Dispense Refill  . colestipol (MICRONIZED COLESTIPOL HCL) 1 G tablet TAKE 1 TABLET BY MOUTH 30 MINUTES PRIOR TO SUPPER* TAKE 1 PRIOR TO BREAKFAST OR LUNCH IF DIARRHEA IS NOT CONTROLLED 60 tablet 11  . esomeprazole (NEXIUM) 20 MG capsule Take 20 mg by mouth daily before breakfast.    . lisinopril (PRINIVIL,ZESTRIL) 10 MG tablet Take 10 mg by mouth daily.     . sertraline (ZOLOFT) 50 MG tablet Take 50 mg by mouth daily.      No current facility-administered medications for  this visit.    Allergies as of 10/05/2014 - Review Complete 10/05/2014  Allergen Reaction Noted  . Penicillins      Family History  Problem Relation Age of Onset  . Hypertension Father   . Heart attack Father   . Hypertension Sister   . Hypertension Mother   . Colon polyps Neg Hx   . Colon cancer Neg Hx     History   Social History  . Marital Status: Married    Spouse Name: N/A    Number of Children: N/A  . Years of Education: N/A   Social History Main Topics  . Smoking status: Never Smoker   . Smokeless tobacco: Never Used  . Alcohol Use: Yes     Comment: socially  . Drug Use: No  . Sexual Activity: Yes    Birth Control/ Protection: Other-see comments     Comment: vasectomy   Other Topics Concern  . None   Social History Narrative    Review of Systems: As mentioned in HPI  Physical Exam: BP 135/83 mmHg  Pulse 84  Temp(Src) 97.2 F (36.2 C) (Oral)  Ht 5\' 2"  (1.575 m)  Wt 209 lb (94.802 kg)  BMI 38.22 kg/m2 General:   Alert and oriented. No distress noted. Pleasant and cooperative.  Head:  Normocephalic and atraumatic. Eyes:  Conjuctiva clear without scleral icterus. Mouth:  Oral mucosa pink and moist. Good dentition. No lesions. Heart:  S1, S2  present without murmurs, rubs, or gallops. Regular rate and rhythm. Abdomen:  +BS, soft, non-tender and non-distended. No rebound or guarding. No HSM or masses noted. Msk:  Symmetrical without gross deformities. Normal posture. Extremities:  Without edema. Neurologic:  Alert and  oriented x4;  grossly normal neurologically. Skin:  Intact without significant lesions or rashes. Psych:  Alert and cooperative. Normal mood and affect.

## 2014-10-11 ENCOUNTER — Encounter: Payer: Self-pay | Admitting: Gastroenterology

## 2014-10-11 NOTE — Assessment & Plan Note (Signed)
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 Doing well with Colestid. Continue current management. Normal colonoscopy on file from 2009. Return in 1 year or sooner if needed.

## 2014-10-11 NOTE — Assessment & Plan Note (Signed)
Without alarm features. Continue dietary and behavior modification. Nexium daily. Return in 1 year.

## 2014-10-14 NOTE — Progress Notes (Signed)
cc'ed to pcp °

## 2015-02-16 ENCOUNTER — Other Ambulatory Visit: Payer: Self-pay | Admitting: Gastroenterology

## 2015-09-06 ENCOUNTER — Encounter: Payer: Self-pay | Admitting: Gastroenterology

## 2015-10-27 ENCOUNTER — Encounter (INDEPENDENT_AMBULATORY_CARE_PROVIDER_SITE_OTHER): Payer: Self-pay

## 2015-10-27 ENCOUNTER — Ambulatory Visit (INDEPENDENT_AMBULATORY_CARE_PROVIDER_SITE_OTHER): Payer: BC Managed Care – PPO | Admitting: Neurology

## 2015-10-27 ENCOUNTER — Encounter: Payer: Self-pay | Admitting: Neurology

## 2015-10-27 VITALS — BP 136/88 | HR 88 | Resp 20 | Ht 62.0 in | Wt 209.0 lb

## 2015-10-27 DIAGNOSIS — N951 Menopausal and female climacteric states: Secondary | ICD-10-CM

## 2015-10-27 DIAGNOSIS — G4730 Sleep apnea, unspecified: Secondary | ICD-10-CM | POA: Diagnosis not present

## 2015-10-27 DIAGNOSIS — R0683 Snoring: Secondary | ICD-10-CM | POA: Diagnosis not present

## 2015-10-27 DIAGNOSIS — G4710 Hypersomnia, unspecified: Secondary | ICD-10-CM | POA: Diagnosis not present

## 2015-10-27 DIAGNOSIS — G4700 Insomnia, unspecified: Secondary | ICD-10-CM

## 2015-10-27 NOTE — Progress Notes (Signed)
SLEEP MEDICINE CLINIC   Provider:  Larey Seat, M D  Referring Provider: Sharilyn Sites, MD Primary Care Physician:  Leonides Grills, MD  Chief Complaint  Patient presents with  . New Patient (Initial Visit)    snoring, gasping for air, rm 10, alone    HPI:  [name] is a 49 y.o. female , seen here as a referral  from Dr. Hilma Favors for a  sleep consultation,  [name] is seen here today on referral of Collene Mares, [place] and Dr. Hilma Favors M.D. at [facility]. She describes that she has at all full sleep pattern in her own words she is snoring short of breath and cannot sleep through the night. She actually doesn't nap in daytime either. There is a long-standing complained of insomnia snoring and sometimes restless legs. She feels that she doesn't get enough sleep and that her energy level is decreased. She also has increasing thirst and feels often very hot she tends to flush.  The shortness of breath also includes coughing but not wheezing.  She has gained weight over the last 5 years and has attributed some of this to cause her fatigue. She has also been diagnosed with irritable bowel syndrome ( not colitis),  high blood pressure and had in the past gestational diabetes but has not been diagnosed with diabetes mellitus since.  2 years ago she shied away from a sleep study that had already been scheduled , but her husband has been reminding her.  He learned during a conference from a  friend  what his wife does at night  fulfills the criteria of sleep apnea -and he is very concerned about her health.  She feels as if she has become a shallow breather in daytime and at nights. Recently she got a bronchitis and this made her sleep " terrible".  Sleep habits are as follows: She said that she lately lays down as early as 8:30 PM because she feels completely exhausted. She endorses no sleepiness but the fatigue score at 53 points.  By feeling completely exhausted she  does not fall asleep. She may initiate sleep as late as midnight but she has a bad habit of looking at the clock. The bedroom is cool and quiet and dark and she does not watch TV in it. She does not feel that she is worried or under exorbitant stress. The insomnia has been chronic and has increased the awake ear. She will sleep longest time probably for about 90 minutes before waking up again and she will have to use the bathroom then. It is not the bus from call that wakes her, there is no pain, no headache. It is difficult to fall asleep again. She has to rise in the morning at about 5 AM and by that time she may have only gotten 2 hours or 3 hours of sleep. She will fit bit between November 2015 and this November and each night seems to be a tossing and turning high activity enterprise. She does not feel that she ever gets restful sleep on that she is 3 charged restored and refreshed. When she wakes up she has a dry mouth she wakes up on her back. But she falls asleep on her left side. She cannot sleep prone. Since she does not nap in the afternoons she is chronically sleep deprived.  Her primary care physician has actually is placed her on a Ventolin inhaler as of November this year because she had coughing and  bronchitis. At the same time she was started on a prednisone Dosepak. It actually accentuated her insomnia , She was also placed on doxycycline and has completed the course. In July she started on Zoloft, in February on lisinopril, and April last year on Nexium and Colestid.   Sleep medical history and family sleep history:  No history history of night terrors, sleep walking or sleep talking. No sleep eating. Her son sleepwalks.   Social history: married, mother of a 22 year old.   Review of Systems: Out of a complete 14 system review, the patient complains of only the following symptoms, and all other reviewed systems are negative. Snoring, gasping, feeling exhausted.   Epworth score 0 ,  Fatigue severity score 53 .    Social History   Social History  . Marital Status: Married    Spouse Name: N/A  . Number of Children: N/A  . Years of Education: N/A   Occupational History  . teacher assistant [place] History Main Topics  . Smoking status: Never Smoker   . Smokeless tobacco: Never Used  . Alcohol Use: Yes     Comment: socially  . Drug Use: No  . Sexual Activity: Yes    Birth Control/ Protection: Other-see comments     Comment: vasectomy   Other Topics Concern  . Not on file   Social History Narrative   Drinks 1 cup of coffee and 12 oz ginger ale.    Family History  Problem Relation Age of Onset  . Hypertension Father   . Heart attack Father   . Hypertension Sister   . Breast cancer Sister   . Hypertension Mother   . Colon polyps Neg Hx   . Colon cancer Neg Hx   . Cervical cancer Sister     Past Medical History  Diagnosis Date  . Irritable bowel syndrome   . GERD (gastroesophageal reflux disease)   . HTN (hypertension)   . CIN I (cervical intraepithelial neoplasia I)   . Cervical atypia, mild 12/2013    Sonohysterogram biopsy showed benign endometrium but inflamed mild atypia and squamous mucosa fragments recommend repeat Pap smear 6 months  . Snoring   . URI (upper respiratory infection)   . Obesity     Past Surgical History  Procedure Laterality Date  . Gynecologic cryosurgery    . Cesarean section  10/2003  . Cholecystectomy  2006  . Cervical cone biopsy  1989    CIN 1, ECC positive  . Colonoscopy  2009    Dr. Oneida Alar: frequent sigmoid colon diverticula, small internal hemrorhoids, colonic biopsies to assess for microscopic colitis, Benign     Current Outpatient Prescriptions  Medication Sig Dispense Refill  . esomeprazole (NEXIUM) 20 MG capsule Take 20 mg by mouth daily before breakfast.    . lisinopril (PRINIVIL,ZESTRIL) 10 MG tablet Take 10 mg by mouth daily.     Marland Kitchen MICRONIZED COLESTIPOL HCL 1 G tablet TAKE 1  TABLET BY MOUTH 30 MINUTES PRIOR TO SUPPER- MAY ALSO TAKE 1 TABLET PRIOR TO BEAKFAST OR LUNCH IF DIARRHEA IS NOT CONTROLLED 60 tablet 3  . sertraline (ZOLOFT) 50 MG tablet Take 50 mg by mouth daily.      No current facility-administered medications for this visit.    Allergies as of 10/27/2015 - Review Complete 10/27/2015  Allergen Reaction Noted  . Penicillins      Vitals: BP 136/88 mmHg  Pulse 88  Resp 20  Ht 5\' 2"  (1.575 m)  Wt 209 lb (94.802 kg)  BMI 38.22 kg/m2 Last Weight:  Wt Readings from Last 1 Encounters:  10/27/15 209 lb (94.802 kg)   [id number] mass index is 38.22 kg/(m^2).     Last Height:   Ht Readings from Last 1 Encounters:  10/27/15 5\' 2"  (1.575 m)    Physical exam:  General: The patient is awake, alert and appears not in acute distress. The patient is well groomed. Head: Normocephalic, atraumatic. Neck is supple. Mallampati 2  neck circumference:   16.5 . Nasal airflow unrestricted , TMJ is not evident . Retrognathia is seen.  Cardiovascular:  Regular rate and rhythm , without  murmurs or carotid bruit, and without distended neck veins. Respiratory: Lungs are clear to auscultation. Skin:  Without evidence of edema, or rash Trunk: BMI is elevated  The patient's posture is erect.   Neurologic exam : The patient is awake and alert, oriented to place and time.   Memory subjective described as intact.  Attention span & concentration ability appears normal.  Speech is fluent,  without dysarthria, dysphonia or aphasia.  Mood and affect are appropriate.  Cranial nerves: Pupils are equal and briskly reactive to light. Funduscopic exam without  evidence of pallor or edema.  Extraocular movements  in vertical and horizontal planes intact and without nystagmus. Visual fields by finger perimetry are intact. Hearing to finger rub intact.   Facial sensation intact to fine touch.  Facial motor strength is symmetric and tongue and uvula move midline. Shoulder shrug was  symmetrical.   Motor exam:  Normal tone, muscle bulk and symmetric strength in all extremities.  Sensory:  Fine touch, pinprick and vibration were normal. Coordination: Rapid alternating movements in the fingers/hands without evidence of ataxia, dysmetria or tremor.  Gait and station: Patient walks without assistive device and is able unassisted to climb up to the exam table. Strength within normal limits.  Stance is stable and normal.  Toe and heel stand were tested . Tandem gait is unfragmented. Turns with  3  Steps. Romberg testing is negative.  Deep tendon reflexes: in the  upper and lower extremities are symmetric and intact. Babinski maneuver response is  downgoing.  The patient was advised of the nature of the diagnosed sleep disorder , the treatment options and risks for general a health and wellness arising from not treating the condition.  I spent more than 45 minutes of face to face time with the patient. Greater than 50% of time was spent in counseling and coordination of care. We have discussed the diagnosis and differential and I answered the patient's questions.     Assessment:  After physical and neurologic examination, review of laboratory studies,  Personal review of imaging studies, reports of other /same  Imaging studies ,  Results of polysomnography/ neurophysiology testing and pre-existing records as far as provided in visit., my assessment is   1) the patient describes waking up gasping for air and having insomnia-  these may be to conditions not correlated to each other. I think that she wakes up frequently at night is probably a factor of insomnia with apnea that she has trouble initiating sleep however is probably not related.  I do wonder if it could be still a menopausal sign. She has started on Zoloft but has not felt that it helped helped with any hot flush. She feels calmer.  2) witnessed snoring, apnea and gasping.   3)obesity  Plan:  Treatment plan and  additional workup :  I  will invite [name] to a sleep study split night polysomnography. I do think that the excessive fatigue is related to quality of sleep deprivation. I would also like to prescribe a sleep aid. My goal is for her to be able to go to sleep and if apnea is found and treated to stay asleep. I may exchange Zoloft for Pristiq which in my experience helps with sleep maintenance insomnia.     Asencion Partridge Levonia Wolfley MD  10/27/2015   Thank you for your referral !  CC: Sharilyn Sites, [place],  [phone]

## 2015-11-04 ENCOUNTER — Other Ambulatory Visit (HOSPITAL_COMMUNITY): Payer: Self-pay | Admitting: Physician Assistant

## 2015-11-04 ENCOUNTER — Ambulatory Visit (HOSPITAL_COMMUNITY)
Admission: RE | Admit: 2015-11-04 | Discharge: 2015-11-04 | Disposition: A | Discharge status: Home / Self Care | Payer: BC Managed Care – PPO | Source: Ambulatory Visit | Attending: Physician Assistant | Admitting: Physician Assistant

## 2015-11-04 DIAGNOSIS — R509 Fever, unspecified: Secondary | ICD-10-CM | POA: Insufficient documentation

## 2015-11-04 DIAGNOSIS — R059 Cough, unspecified: Secondary | ICD-10-CM

## 2015-11-04 DIAGNOSIS — R05 Cough: Secondary | ICD-10-CM | POA: Insufficient documentation

## 2015-11-08 ENCOUNTER — Encounter: Payer: BC Managed Care – PPO | Admitting: Gynecology

## 2015-11-16 ENCOUNTER — Ambulatory Visit (INDEPENDENT_AMBULATORY_CARE_PROVIDER_SITE_OTHER): Payer: BC Managed Care – PPO | Admitting: Neurology

## 2015-11-16 DIAGNOSIS — G4730 Sleep apnea, unspecified: Secondary | ICD-10-CM

## 2015-11-16 DIAGNOSIS — R0683 Snoring: Secondary | ICD-10-CM

## 2015-11-16 DIAGNOSIS — G4733 Obstructive sleep apnea (adult) (pediatric): Secondary | ICD-10-CM

## 2015-11-16 DIAGNOSIS — G4710 Hypersomnia, unspecified: Secondary | ICD-10-CM

## 2015-11-16 DIAGNOSIS — G4700 Insomnia, unspecified: Secondary | ICD-10-CM

## 2015-11-16 DIAGNOSIS — N951 Menopausal and female climacteric states: Secondary | ICD-10-CM

## 2015-11-17 NOTE — Sleep Study (Signed)
Please see the scanned sleep study interpretation located in the procedure tab within the chart review section.   

## 2015-11-23 ENCOUNTER — Telehealth: Payer: Self-pay

## 2015-11-23 DIAGNOSIS — G4733 Obstructive sleep apnea (adult) (pediatric): Secondary | ICD-10-CM

## 2015-11-23 NOTE — Telephone Encounter (Signed)
Called [name] to discuss her sleep study results. I advised [name] that her sleep study showed severe osa and Dr. Brett Fairy recommends starting a CPAP. [name] is agreeable to starting a CPAP. I advised [name] that a DME will be contacting her to set up the CPAP. They will call her to set up the CPAP and check her insurance. [name] verbalized understanding. I advised [name] to use her CPAP at least four or more hours per night. [name] verbalized understanding. A follow up appt was made for 2/13 at 1:30. [name] was advised to lose weight, diet, and exercise if not contraindicated by her other physicians. [name] was advised not to drive or operate hazardous machinery while sleepy. [name] verbalized understanding. Will send to [facility].

## 2015-12-15 ENCOUNTER — Other Ambulatory Visit: Payer: Self-pay

## 2015-12-15 DIAGNOSIS — Z1231 Encounter for screening mammogram for malignant neoplasm of breast: Secondary | ICD-10-CM

## 2015-12-30 ENCOUNTER — Encounter: Payer: BC Managed Care – PPO | Admitting: Gynecology

## 2016-01-04 ENCOUNTER — Encounter: Payer: Self-pay | Admitting: Gynecology

## 2016-01-04 ENCOUNTER — Ambulatory Visit
Admission: RE | Admit: 2016-01-04 | Discharge: 2016-01-04 | Disposition: A | Discharge status: Home / Self Care | Payer: BC Managed Care – PPO | Source: Ambulatory Visit

## 2016-01-04 ENCOUNTER — Other Ambulatory Visit (HOSPITAL_COMMUNITY)
Admission: RE | Admit: 2016-01-04 | Discharge: 2016-01-04 | Disposition: A | Discharge status: Home / Self Care | Payer: BC Managed Care – PPO | Source: Ambulatory Visit | Attending: Gynecology | Admitting: Gynecology

## 2016-01-04 ENCOUNTER — Ambulatory Visit (INDEPENDENT_AMBULATORY_CARE_PROVIDER_SITE_OTHER): Payer: BC Managed Care – PPO | Admitting: Gynecology

## 2016-01-04 VITALS — BP 120/78 | Ht 63.0 in | Wt 210.0 lb

## 2016-01-04 DIAGNOSIS — Z1151 Encounter for screening for human papillomavirus (HPV): Secondary | ICD-10-CM | POA: Diagnosis not present

## 2016-01-04 DIAGNOSIS — Z01419 Encounter for gynecological examination (general) (routine) without abnormal findings: Secondary | ICD-10-CM | POA: Insufficient documentation

## 2016-01-04 DIAGNOSIS — Z1231 Encounter for screening mammogram for malignant neoplasm of breast: Secondary | ICD-10-CM

## 2016-01-04 NOTE — Progress Notes (Signed)
[name] [birth year] [id number]        50 y.o.  G1P1  for annual exam.  Doing well without complaints  Past medical history,surgical history, problem list, medications, allergies, family history and social history were all reviewed and documented as reviewed in the EPIC chart.  ROS:  Performed with pertinent positives and negatives included in the history, assessment and plan.   Additional significant findings :  none   Exam: Caryn Bee assistant Filed Vitals:   01/04/16 1358  BP: 120/78  Height: 5\' 3"  (1.6 m)  Weight: 210 lb (95.255 kg)   General appearance:  Normal affect, orientation and appearance. Skin: Grossly normal HEENT: Without gross lesions.  No cervical or supraclavicular adenopathy. Thyroid normal.  Lungs:  Clear without wheezing, rales or rhonchi Cardiac: RR, without RMG Abdominal:  Soft, nontender, without masses, guarding, rebound, organomegaly or hernia Breasts:  Examined lying and sitting without masses, retractions, discharge or axillary adenopathy. Pelvic:  Ext/BUS/vagina normal  Cervix normal  Uterus anteverted, normal size, shape and contour, midline and mobile nontender   Adnexa without masses or tenderness    Anus and perineum normal   Rectovaginal normal sphincter tone without palpated masses or tenderness.    Assessment/Plan:  50 y.o. G1P1 female for annual exam.   1. Postmenopausal. Has been over a year since her last menstrual period or any bleeding. Having some mild hot flashes night sweats. No vaginal dryness. We'll monitor present and reported any issues or vaginal bleeding. 2. Pap smear/HPV normal 11/2013. Had sonohysterogram biopsy 12/2013 due to irregular bleeding which showed atrophic endometrium but slight squamous atypia in squamous epithelial fragments. P 16 staining negative. Pap smear/HPV done today. Assuming negative them plan expectant management. Does have a history of cone biopsy 1989 with CIN-1. 3. Mammography today. Continue with  annual mammography when due. SBE monthly reviewed. 4. Colonoscopy 2012. Repeat at their recommended interval. 5. Health maintenance. No routine lab work done as patient reports this done elsewhere. Follow up in one year, sooner as needed.   Anastasio Auerbach MD, 2:20 PM 01/04/2016

## 2016-01-04 NOTE — Patient Instructions (Signed)

## 2016-01-06 LAB — CYTOLOGY - PAP

## 2016-01-09 ENCOUNTER — Ambulatory Visit (INDEPENDENT_AMBULATORY_CARE_PROVIDER_SITE_OTHER): Payer: BC Managed Care – PPO | Admitting: Neurology

## 2016-01-09 ENCOUNTER — Encounter: Payer: Self-pay | Admitting: Neurology

## 2016-01-09 VITALS — BP 132/90 | HR 86 | Resp 20 | Ht 62.0 in | Wt 210.0 lb

## 2016-01-09 DIAGNOSIS — G4700 Insomnia, unspecified: Secondary | ICD-10-CM | POA: Diagnosis not present

## 2016-01-09 DIAGNOSIS — Z9989 Dependence on other enabling machines and devices: Principal | ICD-10-CM

## 2016-01-09 DIAGNOSIS — G4730 Sleep apnea, unspecified: Secondary | ICD-10-CM

## 2016-01-09 DIAGNOSIS — G4733 Obstructive sleep apnea (adult) (pediatric): Secondary | ICD-10-CM | POA: Diagnosis not present

## 2016-01-09 DIAGNOSIS — G4761 Periodic limb movement disorder: Secondary | ICD-10-CM

## 2016-01-09 DIAGNOSIS — G2581 Restless legs syndrome: Secondary | ICD-10-CM | POA: Diagnosis not present

## 2016-01-09 MED ORDER — ROPINIROLE HCL 0.25 MG PO TABS: 0.2500 mg | ORAL_TABLET | Freq: Every day | ORAL | Status: DC

## 2016-01-09 MED ORDER — TRIAMCINOLONE ACETONIDE 55 MCG/ACT NA AERO: 2.0000 | INHALATION_SPRAY | Freq: Every day | NASAL | Status: DC

## 2016-01-09 MED ORDER — ZOLPIDEM TARTRATE 10 MG PO TABS: 10.0000 mg | ORAL_TABLET | Freq: Every evening | ORAL | Status: DC | PRN

## 2016-01-09 NOTE — Progress Notes (Signed)
SLEEP MEDICINE CLINIC   Provider:  Larey Seat, M D  Referring Provider: Elsie Lincoln, MD Primary Care Physician:  Purvis Kilts, MD  Chief Complaint  Patient presents with  . Follow-up    hates cpap, not sleeping, rm 10, with son    HPI:  [name] is a 50 y.o. female , was seen here as a referral  from Dr. Orson Ape for a  sleep consultation in 2016, now here for a Rv on CPAP.  Mrs. [name] is seen here today on referral of Collene Mares, [place] and Dr. Hilma Favors M.D. at [facility]. She describes that she has at all full sleep pattern in her own words she is snoring short of breath and cannot sleep through the night. She actually doesn't nap in daytime either. There is a long-standing complained of insomnia snoring and sometimes restless legs. She feels that she doesn't get enough sleep and that her energy level is decreased. She also has increasing thirst and feels often very hot she tends to flush.  The shortness of breath also includes coughing but not wheezing.  She has gained weight over the last 5 years and has attributed some of this to cause her fatigue. She has also been diagnosed with irritable bowel syndrome ( not colitis),  high blood pressure and had in the past gestational diabetes but has not been diagnosed with diabetes mellitus since.  2 years ago she shied away from a sleep study that had already been scheduled , but her husband has been reminding her.  He learned during a conference from a  friend  what his wife does at night  fulfills the criteria of sleep apnea -and he is very concerned about her health.  She feels as if she has become a shallow breather in daytime and at nights. Recently she got a bronchitis and this made her sleep " terrible".  Sleep habits are as follows: She said that she lately lays down as early as 8:30 PM because she feels completely exhausted. She endorses no sleepiness but the fatigue score at 53 points.  By  feeling completely exhausted she does not fall asleep. She may initiate sleep as late as midnight but she has a bad habit of looking at the clock. The bedroom is cool and quiet and dark and she does not watch TV in it. She does not feel that she is worried or under exorbitant stress. The insomnia has been chronic and has increased the awake ear. She will sleep longest time probably for about 90 minutes before waking up again and she will have to use the bathroom then. It is not the bus from call that wakes her, there is no pain, no headache. It is difficult to fall asleep again. She has to rise in the morning at about 5 AM and by that time she may have only gotten 2 hours or 3 hours of sleep. She will fit bit between November 2015 and this November and each night seems to be a tossing and turning high activity enterprise. She does not feel that she ever gets restful sleep on that she is 3 charged restored and refreshed. When she wakes up she has a dry mouth she wakes up on her back. But she falls asleep on her left side. She cannot sleep prone. Since she does not nap in the afternoons she is chronically sleep deprived.  Her primary care physician has actually is placed her on a Ventolin inhaler as  of November this year because she had coughing and bronchitis. At the same time she was started on a prednisone Dosepak. It actually accentuated her insomnia , She was also placed on doxycycline and has completed the course. In July she started on Zoloft, in February on lisinopril, and April last year on Nexium and Colestid. Sleep medical history and family sleep history:  No history history of night terrors, sleep walking or sleep talking. No sleep eating. Her son sleepwalks. Social history: married, mother of a 52 year old.   01-09-2016 Mrs. [name] is here today for her compliance visit. She is a meanwhile 50 year old lady and underwent a sleep study on 12-20 1-16. She was diagnosed with severe apnea at an AHI  of 41.4, lowest oxygen saturation was 75% and a total desaturation time of 48.5 minutes was measured in the first 2 hours. She was titrated to CPAP at 5 beginning pressure to a final pressure of 11 cm water and her AHI was reduced to 0.8 per hour she does have significant and frequent periodic limb movements she actually "" kicked herself awake "about 8-9 times per hour. She is using CPAP, but it is a love hate relationship. She feels paranoid - she has rhinitis and often congested nasal passages which made the use of a smaller interface impossible. Therefore that she is now wearing a full face mask. Given her difficulties with CPAP I have to congratulate her on her compliance she has used the machine 97% of the last 30 days and 83% over 4 hours of continued use were recorded. The average user time is 6 hours 40 minutes the machine is set at 11 cm with 3 cm EPR the residual  AHI is 7.4. I will change her machine to an AutoSet. Our goal will be 5 to 15 cm water with 3 cm EPR. She takes Ambien with CPAP.   Review of Systems: Out of a complete 14 system review, the patient complains of only the following symptoms, and all other reviewed systems are negative. Snoring, gasping, feeling exhausted. Her insomnia is responding to benadryl and ambien.   Epworth score 0 , Fatigue severity score 53 .    Social History   Social History  . Marital Status: Married    Spouse Name: N/A  . Number of Children: N/A  . Years of Education: N/A   Occupational History  . teacher assistant [employer] History Main Topics  . Smoking status: Never Smoker   . Smokeless tobacco: Never Used  . Alcohol Use: 0.0 oz/week    0 Standard drinks or equivalent per week     Comment: socially  . Drug Use: No  . Sexual Activity: Yes    Birth Control/ Protection: Other-see comments     Comment: vasectomy-1st intercourse 1 yo-5 partners   Other Topics Concern  . Not on file   Social History Narrative    Drinks 1 cup of coffee and 12 oz ginger ale.    Family History  Problem Relation Age of Onset  . Hypertension Father   . Heart attack Father   . Hypertension Sister   . Breast cancer Sister 7  . Hypertension Mother   . Colon polyps Neg Hx   . Colon cancer Neg Hx   . Cervical cancer Sister     Past Medical History  Diagnosis Date  . Irritable bowel syndrome   . GERD (gastroesophageal reflux disease)   . HTN (hypertension)   . Cervical atypia,  mild 12/2013    Sonohysterogram biopsy showed benign endometrium but inflamed mild atypia and squamous mucosa fragments recommend repeat Pap smear 6 months  . Snoring   . URI (upper respiratory infection)   . Obesity   . Sleep apnea     C PAP    Past Surgical History  Procedure Laterality Date  . Gynecologic cryosurgery    . Cesarean section  10/2003  . Cholecystectomy  2006  . Cervical cone biopsy  1989    CIN 1, ECC positive  . Colonoscopy  2009    Dr. Oneida Alar: frequent sigmoid colon diverticula, small internal hemrorhoids, colonic biopsies to assess for microscopic colitis, Benign     Current Outpatient Prescriptions  Medication Sig Dispense Refill  . esomeprazole (NEXIUM) 20 MG capsule Take 20 mg by mouth daily before breakfast.    . losartan (COZAAR) 50 MG tablet Take 50 mg by mouth daily.    Marland Kitchen MICRONIZED COLESTIPOL HCL 1 G tablet TAKE 1 TABLET BY MOUTH 30 MINUTES PRIOR TO SUPPER- MAY ALSO TAKE 1 TABLET PRIOR TO BEAKFAST OR LUNCH IF DIARRHEA IS NOT CONTROLLED 60 tablet 3  . sertraline (ZOLOFT) 50 MG tablet Take 50 mg by mouth daily.     . Zolpidem Tartrate (AMBIEN PO) Take by mouth. Reported on 01/09/2016     No current facility-administered medications for this visit.    Allergies as of 01/09/2016 - Review Complete 01/09/2016  Allergen Reaction Noted  . Penicillins Shortness Of Breath     Vitals: BP 132/90 mmHg  Pulse 86  Resp 20  Ht 5\' 2"  (1.575 m)  Wt 210 lb (95.255 kg)  BMI 38.40 kg/m2  LMP 12/05/2013 Last  Weight:  Wt Readings from Last 1 Encounters:  01/09/16 210 lb (95.255 kg)   [id number] mass index is 38.4 kg/(m^2).     Last Height:   Ht Readings from Last 1 Encounters:  01/09/16 5\' 2"  (1.575 m)    Physical exam:  General: The patient is awake, alert and appears not in acute distress. The patient is well groomed. Head: Normocephalic, atraumatic. Neck is supple. Mallampati 2  neck circumference:   16.5 . Nasal airflow is sevrely restricted , TMJ is not evident . Retrognathia is seen.  Cardiovascular:  Regular rate and rhythm, without  murmurs or carotid bruit, and without distended neck veins. Respiratory: Lungs are clear to auscultation. Skin:  Without evidence of edema, or rash Trunk: BMI is elevated  The patient's posture is erect.   Neurologic exam : The patient is awake and alert, oriented to place and time.   Memory subjective described as intact.  Attention span & concentration ability appears normal.  Speech is fluent,  without dysarthria, dysphonia or aphasia.  Mood and affect are appropriate.  Cranial nerves: Pupils are equal and briskly reactive to light. Funduscopic exam without  evidence of pallor or edema.  Extraocular movements  in vertical and horizontal planes intact and without nystagmus. Visual fields by finger perimetry are intact. Hearing to finger rub intact.   Facial sensation intact to fine touch.  Facial motor strength is symmetric and tongue and uvula move midline. Shoulder shrug was symmetrical.    The patient was advised of the nature of the diagnosed sleep disorder , the treatment options and risks for general a health and wellness arising from not treating the condition.  I spent more than 25 minutes of face to face time with the patient. Greater than 50% of time was spent in counseling and  coordination of care. We have discussed the diagnosis and differential and I answered the patient's questions.     Assessment:  After physical and neurologic  examination, review of laboratory studies,  Personal review of imaging studies, reports of other /same Imaging studies. Results of polysomnography/ neurophysiology testing and pre-existing records as far as provided in visit., my assessment is   1) the patient describes waking up gasping for air and having insomnia-  these may be to conditions not correlated to each other. I think that she wakes up frequently at night is probably a factor of insomnia with apnea that she has trouble initiating sleep however is probably not related.  I will continue to provide Ambien as a 3 -4 nights per week medication.  I do wonder if it could be still a menopausal sign.   2)She has started on Zoloft but has not felt that it helped helped with hot flush. She feels calmer. She should take Zoloft in AM>   3) OSA ,severe - AHI was  41.4/ hr. witnessed snoring, apnea and gasping. Improved on CPAP, but claustrophobia.  Obesity is main risk factor.  4) Rhinitis   5) PLMs- frequent and causing arousals.   Plan:  Treatment plan and additional workup :  The patient will change her Zoloft intake times 2 AM which usually reduces insomnia, I would like for her to have Ambien as needed available we had a discussion about the frequency of which Lorrin Mais can lead to habit forming problems. I think she is safe to have Ambien available 3-4 times per week at night. I like for her to continue CPAP but I will not set at 211 cm water any longer. She has to many residual apneas and she reports insomnia she is actually uncomfortable with the CPAP. This may have a psychological reason but also the obstruction of her nasal passages. I will treat her for rhinitis I like for her to use a saline nasal spray and many of my patients have done well with a MediPort. The auto CPAP will be set from 5 through 15 cm water with 3 cm EPR. Mask of choice.    Asencion Partridge Jaylinn Hellenbrand MD  01/09/2016    CC: Elsie Lincoln, [address] [place], [place] [address]

## 2016-02-27 ENCOUNTER — Emergency Department (HOSPITAL_COMMUNITY)
Admission: EM | Admit: 2016-02-27 | Discharge: 2016-02-27 | Disposition: A | Discharge status: Home / Self Care | Payer: BC Managed Care – PPO | Attending: Emergency Medicine | Admitting: Emergency Medicine

## 2016-02-27 ENCOUNTER — Encounter (HOSPITAL_COMMUNITY): Payer: Self-pay

## 2016-02-27 ENCOUNTER — Emergency Department (HOSPITAL_COMMUNITY): Payer: BC Managed Care – PPO

## 2016-02-27 DIAGNOSIS — J019 Acute sinusitis, unspecified: Secondary | ICD-10-CM | POA: Insufficient documentation

## 2016-02-27 DIAGNOSIS — I10 Essential (primary) hypertension: Secondary | ICD-10-CM | POA: Diagnosis not present

## 2016-02-27 DIAGNOSIS — R0789 Other chest pain: Secondary | ICD-10-CM | POA: Insufficient documentation

## 2016-02-27 DIAGNOSIS — E669 Obesity, unspecified: Secondary | ICD-10-CM | POA: Insufficient documentation

## 2016-02-27 DIAGNOSIS — Z79899 Other long term (current) drug therapy: Secondary | ICD-10-CM | POA: Insufficient documentation

## 2016-02-27 DIAGNOSIS — M2550 Pain in unspecified joint: Secondary | ICD-10-CM | POA: Insufficient documentation

## 2016-02-27 DIAGNOSIS — M791 Myalgia: Secondary | ICD-10-CM | POA: Diagnosis not present

## 2016-02-27 LAB — TROPONIN I: Troponin I: <0.03 ng/mL (ref <0.031)

## 2016-02-27 LAB — BASIC METABOLIC PANEL
Anion gap: 6 (ref 5–15)
BUN: 11 mg/dL (ref 6–20)
CALCIUM: 8.6 mg/dL — AB (ref 8.9–10.3)
CO2: 31 mmol/L (ref 22–32)
CREATININE: 0.73 mg/dL (ref 0.44–1.00)
Chloride: 103 mmol/L (ref 101–111)
GFR calc non Af Amer: >60 mL/min (ref >60)
Glucose, Bld: 98 mg/dL (ref 65–99)
Potassium: 3.7 mmol/L (ref 3.5–5.1)
SODIUM: 140 mmol/L (ref 135–145)

## 2016-02-27 LAB — CBC WITH DIFFERENTIAL/PLATELET
BASOS PCT: 0 %
Basophils Absolute: 0.1 10*3/uL (ref 0.0–0.1)
EOS ABS: 0.6 10*3/uL (ref 0.0–0.7)
Eosinophils Relative: 5 %
HCT: 43 % (ref 36.0–46.0)
HEMOGLOBIN: 13.6 g/dL (ref 12.0–15.0)
Lymphocytes Relative: 30 %
Lymphs Abs: 3.6 10*3/uL (ref 0.7–4.0)
MCH: 29.2 pg (ref 26.0–34.0)
MCHC: 31.6 g/dL (ref 30.0–36.0)
MCV: 92.5 fL (ref 78.0–100.0)
MONO ABS: 0.8 10*3/uL (ref 0.1–1.0)
MONOS PCT: 7 %
NEUTROS PCT: 58 %
Neutro Abs: 6.9 10*3/uL (ref 1.7–7.7)
Platelets: 331 10*3/uL (ref 150–400)
RBC: 4.65 MIL/uL (ref 3.87–5.11)
RDW: 14.4 % (ref 11.5–15.5)
WBC: 12 10*3/uL — ABNORMAL HIGH (ref 4.0–10.5)

## 2016-02-27 LAB — D-DIMER, QUANTITATIVE: D-Dimer, Quant: 0.9 ug/mL-FEU — ABNORMAL HIGH (ref 0.00–0.50)

## 2016-02-27 LAB — BRAIN NATRIURETIC PEPTIDE: B NATRIURETIC PEPTIDE 5: 38 pg/mL (ref 0.0–100.0)

## 2016-02-27 MED ORDER — IOHEXOL 350 MG/ML SOLN
150.0000 mL | Freq: Once | INTRAVENOUS | Status: AC | PRN
  Administered 2016-02-27: 100 mL via INTRAVENOUS

## 2016-02-27 MED ORDER — DOXYCYCLINE HYCLATE 100 MG PO CAPS: 100.0000 mg | ORAL_CAPSULE | Freq: Two times a day (BID) | ORAL | Status: DC

## 2016-02-27 NOTE — ED Notes (Signed)
Pulse ox 98% while ambulating.

## 2016-02-27 NOTE — ED Notes (Signed)
Pt states she was given ASA and 1 NTG at the doctors office. States she had a EKG done but did not bring it with her

## 2016-02-27 NOTE — ED Provider Notes (Signed)
CSN: [id number]     Arrival date & time 02/27/16  [id number] History   First MD Initiated Contact with Patient 02/27/16 0840     Chief Complaint  Patient presents with  . Chest Pain     (Consider location/radiation/quality/duration/timing/severity/associated sxs/prior Treatment) HPI Comments: Patient sent from PCPs office with heaviness in her chest over the past 2 days. She has had a cough and upper respiratory symptoms with sinus congestion since October and has been on several courses of antibiotics. Today she made an appointment with her PCP because of hoarse voice and congestion and heaviness in her chest. He did an EKG and gave her nitroglycerin and sent her to the ED. EKG is not available. Patient states her chest pressure has been constant over the past 3 days. Nothing makes it better or worse. She has some difficulty breathing with lying down. Her cough is dry and she has a hoarse voice subjective chills but no fever. Denies any cardiac history. She's never had a stress test. She is not a smoker. She does have sleep apnea and is compliant with C Pap. Denies any abdominal pain, nausea or vomiting. No leg pain or leg swelling.  The history is provided by the patient.    Past Medical History  Diagnosis Date  . Irritable bowel syndrome   . GERD (gastroesophageal reflux disease)   . HTN (hypertension)   . Cervical atypia, mild 12/2013    Sonohysterogram biopsy showed benign endometrium but inflamed mild atypia and squamous mucosa fragments recommend repeat Pap smear 6 months  . Snoring   . URI (upper respiratory infection)   . Obesity   . Sleep apnea     C PAP   Past Surgical History  Procedure Laterality Date  . Gynecologic cryosurgery    . Cesarean section  10/2003  . Cholecystectomy  2006  . Cervical cone biopsy  1989    CIN 1, ECC positive  . Colonoscopy  2009    Dr. Oneida Alar: frequent sigmoid colon diverticula, small internal hemrorhoids, colonic biopsies to assess for microscopic  colitis, Benign    Family History  Problem Relation Age of Onset  . Hypertension Father   . Heart attack Father   . Hypertension Sister   . Breast cancer Sister 53  . Hypertension Mother   . Colon polyps Neg Hx   . Colon cancer Neg Hx   . Cervical cancer Sister    Social History  Substance Use Topics  . Smoking status: Never Smoker   . Smokeless tobacco: Never Used  . Alcohol Use: 0.0 oz/week    0 Standard drinks or equivalent per week     Comment: socially   OB History    Gravida Para Term Preterm AB TAB SAB Ectopic Multiple Living   1 1        1      Review of Systems  Constitutional: Positive for fatigue. Negative for fever, chills and activity change.  HENT: Positive for congestion, rhinorrhea and voice change. Negative for ear discharge.   Respiratory: Positive for cough, chest tightness and shortness of breath.   Cardiovascular: Positive for chest pain.  Gastrointestinal: Negative for nausea, vomiting, abdominal pain and diarrhea.  Genitourinary: Negative for vaginal bleeding and vaginal discharge.  Musculoskeletal: Positive for myalgias and arthralgias.  Skin: Negative for rash.  Neurological: Negative for dizziness, weakness and headaches.   A complete 10 system review of systems was obtained and all systems are negative except as noted in the HPI and  PMH.     Allergies  Penicillins  Home Medications   Prior to Admission medications   Medication Sig Start Date End Date Taking? Authorizing Provider  esomeprazole (NEXIUM) 20 MG capsule Take 20 mg by mouth daily before breakfast.   Yes Historical Provider, MD  losartan (COZAAR) 50 MG tablet Take 50 mg by mouth daily.   Yes Historical Provider, MD  MICRONIZED COLESTIPOL HCL 1 G tablet TAKE 1 TABLET BY MOUTH 30 MINUTES PRIOR TO SUPPER- MAY ALSO TAKE 1 TABLET PRIOR TO BEAKFAST OR LUNCH IF DIARRHEA IS NOT CONTROLLED 02/21/15  Yes Carlis Stable, NP  rOPINIRole (REQUIP) 0.25 MG tablet Take 1 tablet (0.25 mg total) by  mouth at bedtime. 01/09/16  Yes Carmen Dohmeier, MD  sertraline (ZOLOFT) 50 MG tablet Take 50 mg by mouth every morning. 07/29/13  Yes Historical Provider, MD  triamcinolone (NASACORT AQ) 55 MCG/ACT AERO nasal inhaler Place 2 sprays into the nose daily. 01/09/16  Yes Carmen Dohmeier, MD  zolpidem (AMBIEN) 10 MG tablet Take 1 tablet (10 mg total) by mouth at bedtime as needed. Reported on 01/09/2016 01/09/16 02/27/16 Yes Asencion Partridge Dohmeier, MD  doxycycline (VIBRAMYCIN) 100 MG capsule Take 1 capsule (100 mg total) by mouth 2 (two) times daily. 02/27/16   Ezequiel Essex, MD   BP 120/68 mmHg  Pulse 88  Temp(Src) 97.8 F (36.6 C) (Oral)  Resp 16  Ht 5\' 2"  (1.575 m)  Wt 212 lb (96.163 kg)  BMI 38.77 kg/m2  SpO2 100%  LMP 12/05/2013 Physical Exam  Constitutional: She is oriented to person, place, and time. She appears well-developed and well-nourished. No distress.  Congested voice  HENT:  Head: Normocephalic and atraumatic.  Right Ear: External ear normal.  Left Ear: External ear normal.  Mouth/Throat: Oropharynx is clear and moist. No oropharyngeal exudate.  Eyes: Conjunctivae and EOM are normal. Pupils are equal, round, and reactive to light.  Neck: Normal range of motion. Neck supple.  No meningismus.  Cardiovascular: Normal rate, regular rhythm, normal heart sounds and intact distal pulses.   No murmur heard. Pulmonary/Chest: Effort normal and breath sounds normal. No respiratory distress. She exhibits tenderness.  Abdominal: Soft. There is no tenderness. There is no rebound and no guarding.  Musculoskeletal: Normal range of motion. She exhibits no edema or tenderness.  Neurological: She is alert and oriented to person, place, and time. No cranial nerve deficit. She exhibits normal muscle tone. Coordination normal.  No ataxia on finger to nose bilaterally. No pronator drift. 5/5 strength throughout. CN 2-12 intact.Equal grip strength. Sensation intact.   Skin: Skin is warm.  Psychiatric: She has  a normal mood and affect. Her behavior is normal.  Nursing note and vitals reviewed.   ED Course  Procedures (including critical care time) Labs Review Labs Reviewed  CBC WITH DIFFERENTIAL/PLATELET - Abnormal; Notable for the following:    WBC 12.0 (*)    All other components within normal limits  BASIC METABOLIC PANEL - Abnormal; Notable for the following:    Calcium 8.6 (*)    All other components within normal limits  D-DIMER, QUANTITATIVE (NOT AT [facility]) - Abnormal; Notable for the following:    D-Dimer, Quant 0.90 (*)    All other components within normal limits  TROPONIN I  BRAIN NATRIURETIC PEPTIDE  TROPONIN I    Imaging Review Dg Chest 2 View  02/27/2016  CLINICAL DATA:  Mid chest pain yesterday. Cough and cold symptoms for 3 days. EXAM: CHEST  2 VIEW COMPARISON:  11/04/2015  FINDINGS: The cardiomediastinal silhouette is within normal limits. The lungs are well inflated and clear. There is no evidence of pleural effusion or pneumothorax. No acute osseous abnormality is identified. IMPRESSION: No active cardiopulmonary disease. Electronically Signed   By: Logan Bores M.D.   On: 02/27/2016 09:28   Ct Angio Chest Pe W/cm &/or Wo Cm  02/27/2016  CLINICAL DATA:  Chest heaviness, cough since 08/2015 worsening over last 3 days. EXAM: CT ANGIOGRAPHY CHEST WITH CONTRAST TECHNIQUE: Multidetector CT imaging of the chest was performed using the standard protocol during bolus administration of intravenous contrast. Multiplanar CT image reconstructions and MIPs were obtained to evaluate the vascular anatomy. CONTRAST:  159mL OMNIPAQUE IOHEXOL 350 MG/ML SOLN COMPARISON:  02/27/2016 chest x-ray FINDINGS: Mediastinum/Nodes: No filling defects in the pulmonary arteries to suggest pulmonary emboli. Heart is mildly enlarged. Aorta is normal caliber. No mediastinal, hilar, or axillary adenopathy. Small hiatal hernia. Lungs/Pleura: Mild vascular congestion. Ground-glass opacities in the lower lobes could  reflect atelectasis or early edema. No pleural effusions. Upper abdomen: Imaging into the upper abdomen shows no acute findings. Musculoskeletal: Chest wall soft tissues are unremarkable. No acute bony abnormality. Review of the MIP images confirms the above findings. IMPRESSION: Borderline heart size with mild vascular congestion. Ground-glass opacities in the lower lobes could reflect atelectasis or early edema. No evidence of pulmonary embolus. Small hiatal hernia. Electronically Signed   By: Rolm Baptise M.D.   On: 02/27/2016 11:42   I have personally reviewed and evaluated these images and lab results as part of my medical decision-making.   EKG Interpretation   Date/Time:  Monday February 27 2016 08:31:19 EDT Ventricular Rate:  80 PR Interval:  144 QRS Duration: 97 QT Interval:  378 QTC Calculation: 436 R Axis:   13 Text Interpretation:  Sinus rhythm Baseline wander in lead(s) V1 No  previous ECGs available Confirmed by Wyvonnia Dusky  MD, Phyllicia Dudek [phone]) on  02/27/2016 8:40:10 AM      MDM   Final diagnoses:  Atypical chest pain  Acute sinusitis, recurrence not specified, unspecified location   constant chest heaviness for the past 3 days in setting of upper respiratory illness. EKG is normal sinus rhythm without acute ST changes. There is no comparison.  EKG nsr. Troponin negative. CXR negative. D/w Dr. Hilma Favors. He feels patient does not have ACS but wanted labs checked.  D_dimer elevated.  No PE, mild vascular congestion. BNP normal.  Doubt acute CHF.Marland Kitchen No EF on file, no hypoxia with ambulation.   Atypical chest pain ongoing for several days. Low suspicion for ACS. Troponin negative x2. Treat for sinusitis/bronchitis.  Follow up with PCP for stress test. Return precautions discussed.  Ezequiel Essex, MD 02/27/16 [phone]

## 2016-02-27 NOTE — ED Notes (Signed)
Pt states she is ready to go home at this time.  States she has a hair appt at 1330 she needs to go to.

## 2016-02-27 NOTE — ED Notes (Signed)
Complain of heaviness in her chest and a cough off and on since October

## 2016-02-27 NOTE — Discharge Instructions (Signed)
Nonspecific Chest Pain  No evidence of heart attack or blood clot in the lung. Follow-up with your doctor to schedule a stress test. Return to the ED if you develop new or worsening symptoms. Chest pain can be caused by many different conditions. There is always a chance that your pain could be related to something serious, such as a heart attack or a blood clot in your lungs. Chest pain can also be caused by conditions that are not life-threatening. If you have chest pain, it is very important to follow up with your health care provider. CAUSES  Chest pain can be caused by:  Heartburn.  Pneumonia or bronchitis.  Anxiety or stress.  Inflammation around your heart (pericarditis) or lung (pleuritis or pleurisy).  A blood clot in your lung.  A collapsed lung (pneumothorax). It can develop suddenly on its own (spontaneous pneumothorax) or from trauma to the chest.  Shingles infection (varicella-zoster virus).  Heart attack.  Damage to the bones, muscles, and cartilage that make up your chest wall. This can include:  Bruised bones due to injury.  Strained muscles or cartilage due to frequent or repeated coughing or overwork.  Fracture to one or more ribs.  Sore cartilage due to inflammation (costochondritis). RISK FACTORS  Risk factors for chest pain may include:  Activities that increase your risk for trauma or injury to your chest.  Respiratory infections or conditions that cause frequent coughing.  Medical conditions or overeating that can cause heartburn.  Heart disease or family history of heart disease.  Conditions or health behaviors that increase your risk of developing a blood clot.  Having had chicken pox (varicella zoster). SIGNS AND SYMPTOMS Chest pain can feel like:  Burning or tingling on the surface of your chest or deep in your chest.  Crushing, pressure, aching, or squeezing pain.  Dull or sharp pain that is worse when you move, cough, or take a deep  breath.  Pain that is also felt in your back, neck, shoulder, or arm, or pain that spreads to any of these areas. Your chest pain may come and go, or it may stay constant. DIAGNOSIS Lab tests or other studies may be needed to find the cause of your pain. Your health care provider may have you take a test called an ambulatory ECG (electrocardiogram). An ECG records your heartbeat patterns at the time the test is performed. You may also have other tests, such as:  Transthoracic echocardiogram (TTE). During echocardiography, sound waves are used to create a picture of all of the heart structures and to look at how blood flows through your heart.  Transesophageal echocardiogram (TEE).This is a more advanced imaging test that obtains images from inside your body. It allows your health care provider to see your heart in finer detail.  Cardiac monitoring. This allows your health care provider to monitor your heart rate and rhythm in real time.  Holter monitor. This is a portable device that records your heartbeat and can help to diagnose abnormal heartbeats. It allows your health care provider to track your heart activity for several days, if needed.  Stress tests. These can be done through exercise or by taking medicine that makes your heart beat more quickly.  Blood tests.  Imaging tests. TREATMENT  Your treatment depends on what is causing your chest pain. Treatment may include:  Medicines. These may include:  Acid blockers for heartburn.  Anti-inflammatory medicine.  Pain medicine for inflammatory conditions.  Antibiotic medicine, if an infection is present.  Medicines to dissolve blood clots.  Medicines to treat coronary artery disease.  Supportive care for conditions that do not require medicines. This may include:  Resting.  Applying heat or cold packs to injured areas.  Limiting activities until pain decreases. HOME CARE INSTRUCTIONS  If you were prescribed an  antibiotic medicine, finish it all even if you start to feel better.  Avoid any activities that bring on chest pain.  Do not use any tobacco products, including cigarettes, chewing tobacco, or electronic cigarettes. If you need help quitting, ask your health care provider.  Do not drink alcohol.  Take medicines only as directed by your health care provider.  Keep all follow-up visits as directed by your health care provider. This is important. This includes any further testing if your chest pain does not go away.  If heartburn is the cause for your chest pain, you may be told to keep your head raised (elevated) while sleeping. This reduces the chance that acid will go from your stomach into your esophagus.  Make lifestyle changes as directed by your health care provider. These may include:  Getting regular exercise. Ask your health care provider to suggest some activities that are safe for you.  Eating a heart-healthy diet. A registered dietitian can help you to learn healthy eating options.  Maintaining a healthy weight.  Managing diabetes, if necessary.  Reducing stress. SEEK MEDICAL CARE IF:  Your chest pain does not go away after treatment.  You have a rash with blisters on your chest.  You have a fever. SEEK IMMEDIATE MEDICAL CARE IF:   Your chest pain is worse.  You have an increasing cough, or you cough up blood.  You have severe abdominal pain.  You have severe weakness.  You faint.  You have chills.  You have sudden, unexplained chest discomfort.  You have sudden, unexplained discomfort in your arms, back, neck, or jaw.  You have shortness of breath at any time.  You suddenly start to sweat, or your skin gets clammy.  You feel nauseous or you vomit.  You suddenly feel light-headed or dizzy.  Your heart begins to beat quickly, or it feels like it is skipping beats. These symptoms may represent a serious problem that is an emergency. Do not wait to  see if the symptoms will go away. Get medical help right away. Call your local emergency services (911 in the [place] Do not drive yourself to the hospital.   This information is not intended to replace advice given to you by your health care provider. Make sure you discuss any questions you have with your health care provider.   Document Released: 08/22/2005 Document Revised: 12/03/2014 Document Reviewed: 06/18/2014 Elsevier Interactive Patient Education [employer].

## 2016-03-21 ENCOUNTER — Other Ambulatory Visit (INDEPENDENT_AMBULATORY_CARE_PROVIDER_SITE_OTHER): Payer: Self-pay | Admitting: Otolaryngology

## 2016-03-21 DIAGNOSIS — J0101 Acute recurrent maxillary sinusitis: Secondary | ICD-10-CM

## 2016-03-26 ENCOUNTER — Ambulatory Visit (HOSPITAL_COMMUNITY)
Admission: RE | Admit: 2016-03-26 | Discharge: 2016-03-26 | Disposition: A | Discharge status: Home / Self Care | Payer: BC Managed Care – PPO | Source: Ambulatory Visit | Attending: Otolaryngology | Admitting: Otolaryngology

## 2016-03-26 DIAGNOSIS — J0101 Acute recurrent maxillary sinusitis: Secondary | ICD-10-CM | POA: Diagnosis present

## 2016-03-26 DIAGNOSIS — J320 Chronic maxillary sinusitis: Secondary | ICD-10-CM | POA: Insufficient documentation

## 2016-04-02 ENCOUNTER — Other Ambulatory Visit: Payer: Self-pay | Admitting: Otolaryngology

## 2016-04-11 ENCOUNTER — Ambulatory Visit (INDEPENDENT_AMBULATORY_CARE_PROVIDER_SITE_OTHER): Payer: BC Managed Care – PPO | Admitting: Neurology

## 2016-04-11 ENCOUNTER — Encounter: Payer: Self-pay | Admitting: Neurology

## 2016-04-11 VITALS — BP 122/86 | HR 88 | Resp 20 | Ht 62.0 in | Wt 206.0 lb

## 2016-04-11 DIAGNOSIS — G4733 Obstructive sleep apnea (adult) (pediatric): Secondary | ICD-10-CM | POA: Diagnosis not present

## 2016-04-11 DIAGNOSIS — J322 Chronic ethmoidal sinusitis: Secondary | ICD-10-CM | POA: Insufficient documentation

## 2016-04-11 DIAGNOSIS — J0121 Acute recurrent ethmoidal sinusitis: Secondary | ICD-10-CM

## 2016-04-11 DIAGNOSIS — J309 Allergic rhinitis, unspecified: Secondary | ICD-10-CM | POA: Diagnosis not present

## 2016-04-11 DIAGNOSIS — Z9989 Dependence on other enabling machines and devices: Secondary | ICD-10-CM

## 2016-04-11 MED ORDER — ZOLPIDEM TARTRATE 10 MG PO TABS: 10.0000 mg | ORAL_TABLET | Freq: Every evening | ORAL | Status: DC | PRN

## 2016-04-11 NOTE — Progress Notes (Signed)
SLEEP MEDICINE CLINIC   Provider:  Larey Seat, M D  Referring Provider: Sharilyn Sites, MD Primary Care Physician:  Purvis Kilts, MD  Chief Complaint  Patient presents with  . Follow-up    hates cpap, rm 11, alone    HPI:  [name] is a 50 y.o. female , was seen here as a referral  from Dr. Hilma Favors for a  sleep consultation in 2016, now here for a Rv on CPAP.  [name] is seen here today on referral of Collene Mares, [place] and Dr. Hilma Favors M.D. at [facility]. She describes that she has at all full sleep pattern in her own words she is snoring short of breath and cannot sleep through the night. She actually doesn't nap in daytime either. There is a long-standing complained of insomnia snoring and sometimes restless legs. She feels that she doesn't get enough sleep and that her energy level is decreased. She also has increasing thirst and feels often very hot she tends to flush.  The shortness of breath also includes coughing but not wheezing.  She has gained weight over the last 5 years and has attributed some of this to cause her fatigue. She has also been diagnosed with irritable bowel syndrome ( not colitis),  high blood pressure and had in the past gestational diabetes but has not been diagnosed with diabetes mellitus since.  2 years ago she shied away from a sleep study that had already been scheduled , but her husband has been reminding her.  He learned during a conference from a  friend  what his wife does at night  fulfills the criteria of sleep apnea -and he is very concerned about her health.  She feels as if she has become a shallow breather in daytime and at nights. Recently she got a bronchitis and this made her sleep " terrible".  Sleep habits are as follows: She said that she lately lays down as early as 8:30 PM because she feels completely exhausted. She endorses no sleepiness but the fatigue score at 53 points.  By feeling completely  exhausted she does not fall asleep. She may initiate sleep as late as midnight but she has a bad habit of looking at the clock. The bedroom is cool and quiet and dark and she does not watch TV in it. She does not feel that she is worried or under exorbitant stress. The insomnia has been chronic and has increased the awake ear. She will sleep longest time probably for about 90 minutes before waking up again and she will have to use the bathroom then. It is not the bus from call that wakes her, there is no pain, no headache. It is difficult to fall asleep again. She has to rise in the morning at about 5 AM and by that time she may have only gotten 2 hours or 3 hours of sleep. She will fit bit between November 2015 and this November and each night seems to be a tossing and turning high activity enterprise. She does not feel that she ever gets restful sleep on that she is 3 charged restored and refreshed. When she wakes up she has a dry mouth she wakes up on her back. But she falls asleep on her left side. She cannot sleep prone. Since she does not nap in the afternoons she is chronically sleep deprived.  Her primary care physician has actually is placed her on a Ventolin inhaler as of November this  year because she had coughing and bronchitis. At the same time she was started on a prednisone Dosepak. It actually accentuated her insomnia , She was also placed on doxycycline and has completed the course. In July she started on Zoloft, in February on lisinopril, and April last year on Nexium and Colestid. Sleep medical history and family sleep history:  No history history of night terrors, sleep walking or sleep talking. No sleep eating. Her son sleepwalks. Social history: married, mother of a 63 year old.   01-09-2016 [name] is here today for her compliance visit. She is a meanwhile 50 year old lady and underwent a sleep study on 12-20 1-16. She was diagnosed with severe apnea at an AHI of 41.4, lowest  oxygen saturation was 75% and a total desaturation time of 48.5 minutes was measured in the first 2 hours. She was titrated to CPAP at 5 beginning pressure to a final pressure of 11 cm water and her AHI was reduced to 0.8 per hour she does have significant and frequent periodic limb movements she actually "" kicked herself awake "about 8-9 times per hour. She is using CPAP, but it is a love hate relationship. She feels paranoid - she has rhinitis and often congested nasal passages which made the use of a smaller interface impossible. Therefore that she is now wearing a full face mask. Given her difficulties with CPAP I have to congratulate her on her compliance she has used the machine 97% of the last 30 days and 83% over 4 hours of continued use were recorded. The average user time is 6 hours 40 minutes the machine is set at 11 cm with 3 cm EPR the residual  AHI is 7.4. I will change her machine to an AutoSet. Our goal will be 5 to 15 cm water with 3 cm EPR. She takes Ambien with CPAP.  the patient describes waking up gasping for air and having insomnia-  these may be to conditions not correlated to each other. I think that she wakes up frequently at night is probably a factor of insomnia with apnea that she has trouble initiating sleep however is probably not related.  I will continue to provide Ambien as a 3 -4 nights per week medication.  I do wonder if it could be still a menopausal sign.   2)She has started on Zoloft but has not felt that it helped helped with hot flush. She feels calmer. She should take Zoloft in AM>   3) OSA ,severe - AHI was  41.4/ hr. witnessed snoring, apnea and gasping. Improved on CPAP, but claustrophobia.  Obesity is main risk factor.  4) Rhinitis   5) PLMs- frequent and causing arousals.    Interval history from 04/11/2016  [name] is looking forward to a septoplasty and sinus surgery in June of this year, but school is out. 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 The patient has a 97% compliance for days of use, CPAP has been used 27 out of 30 days for over 4 hours with an average user time of 6 hours and 7 minutes. This is an AutoSet between 5 and 15. 3 cm EPR. There are no major air leaks, the 91st percentile pressure is 13.5 and the residual AHI is 2.0. [name] will be excused from using CPAP after her nasal and sinus surgery for a period of 4  weeks until ENT clears her to resume. She can remain on the current AutoSet. I predict that she may need less pressure after the surgery.  Review of Systems: Out of a complete 14 system review, the patient complains of only the following symptoms, and all other reviewed systems are negative. Snoring, gasping, feeling exhausted. Her insomnia is responding to benadryl and ambien.   Epworth score 2, Fatigue severity score 40 from 53 prior to CPAP.  She is continuously nasal congested and today as well. She also has ciliary injection and hayfever with itching of the eyes. Ambien is a new best friend, I hope that this can also be reduced after her nasal surgery .    Social History   Social History  . Marital Status: Married    Spouse Name: N/A  . Number of Children: N/A  . Years of Education: N/A   Occupational History  . teacher assistant [employer] History Main Topics  . Smoking status: Never Smoker   . Smokeless tobacco: Never Used  . Alcohol Use: 0.0 oz/week    0 Standard drinks or equivalent per week     Comment: socially  . Drug Use: No  . Sexual Activity: Yes    Birth Control/ Protection: Other-see comments     Comment: vasectomy-1st intercourse 86 yo-5 partners   Other Topics Concern  . Not on file   Social History Narrative   Drinks 1 cup of coffee and 12 oz ginger ale.    Family History  Problem Relation Age of Onset  . Hypertension Father   .  Heart attack Father   . Hypertension Sister   . Breast cancer Sister 25  . Hypertension Mother   . Colon polyps Neg Hx   . Colon cancer Neg Hx   . Cervical cancer Sister     Past Medical History  Diagnosis Date  . Irritable bowel syndrome   . GERD (gastroesophageal reflux disease)   . HTN (hypertension)   . Cervical atypia, mild 12/2013    Sonohysterogram biopsy showed benign endometrium but inflamed mild atypia and squamous mucosa fragments recommend repeat Pap smear 6 months  . Snoring   . URI (upper respiratory infection)   . Obesity   . Sleep apnea     C PAP    Past Surgical History  Procedure Laterality Date  . Gynecologic cryosurgery    . Cesarean section  10/2003  . Cholecystectomy  2006  . Cervical cone biopsy  1989    CIN 1, ECC positive  . Colonoscopy  2009    Dr. Oneida Alar: frequent sigmoid colon diverticula, small internal hemrorhoids, colonic biopsies to assess for microscopic colitis, Benign     Current Outpatient Prescriptions  Medication Sig Dispense Refill  . esomeprazole (NEXIUM) 20 MG capsule Take 20 mg by mouth daily before breakfast.    . losartan (COZAAR) 50 MG tablet Take 50 mg by mouth daily.    Marland Kitchen MICRONIZED COLESTIPOL HCL 1 G tablet TAKE 1 TABLET BY MOUTH 30 MINUTES PRIOR TO SUPPER- MAY ALSO TAKE 1 TABLET PRIOR TO BEAKFAST OR LUNCH IF DIARRHEA IS NOT CONTROLLED 60 tablet 3  . rOPINIRole (REQUIP) 0.25 MG tablet Take 1 tablet (0.25 mg total) by mouth at bedtime. 30 tablet 0  . sertraline (ZOLOFT) 50 MG tablet Take 50 mg by mouth every morning.    . zolpidem (AMBIEN) 10 MG tablet Take 1 tablet (10 mg total) by mouth at bedtime as needed. Reported on 01/09/2016 30  tablet 1   No current facility-administered medications for this visit.    Allergies as of 04/11/2016 - Review Complete 04/11/2016  Allergen Reaction Noted  . Penicillins Shortness Of Breath     Vitals: BP 122/86 mmHg  Pulse 88  Resp 20  Ht 5\' 2"  (1.575 m)  Wt 206 lb (93.441 kg)  BMI  37.67 kg/m2  LMP 12/05/2013 Last Weight:  Wt Readings from Last 1 Encounters:  04/11/16 206 lb (93.441 kg)   [id number] mass index is 37.67 kg/(m^2).     Last Height:   Ht Readings from Last 1 Encounters:  04/11/16 5\' 2"  (1.575 m)    Physical exam:  General: The patient is awake, alert and appears not in acute distress. The patient is well groomed. Head: Normocephalic, atraumatic. Neck is supple. Mallampati 2  neck circumference:   16.5 . Nasal airflow is sevrely restricted , TMJ is not evident . Retrognathia is seen.  Cardiovascular:  Regular rate and rhythm, without  murmurs or carotid bruit, and without distended neck veins. Respiratory: Lungs are clear to auscultation. Skin:  Without evidence of edema, or rash Trunk: BMI is elevated  The patient's posture is erect.   Neurologic exam : The patient is awake and alert, oriented to place and time.   Memory subjective described as intact.  Attention span & concentration ability appears normal.  Speech is fluent,  without dysarthria, dysphonia or aphasia.  Mood and affect are appropriate.  Cranial nerves: Pupils are equal and briskly reactive to light. Funduscopic exam without  evidence of pallor or edema.  Extraocular movements  in vertical and horizontal planes intact and without nystagmus. Visual fields by finger perimetry are intact. Hearing to finger rub intact.   Facial sensation intact to fine touch.  Facial motor strength is symmetric and tongue and uvula move midline. Shoulder shrug was symmetrical.   Motor- no increased tone, no rigor.   The patient was advised of the nature of the diagnosed sleep disorder , the treatment options and risks for general a health and wellness arising from not treating the condition.  I spent more than 25 minutes of face to face time with the patient. Greater than 50% of time was spent in counseling and coordination of care. We have discussed the diagnosis and differential and I answered the  patient's questions.     Assessment:  After physical and neurologic examination, review of laboratory studies,  Personal review of imaging studies, reports of other /same Imaging studies. Results of polysomnography/ neurophysiology testing and pre-existing records as far as provided in visit., my assessment is   1) OSA ,severe - AHI was  41.4/ hr. witnessed snoring, apnea and gasping. Improved on CPAP, but claustrophobia.  Obesity is main risk factor.  2) chronic sinusitis, nasal congestion and allergic but constant  Rhinitis , not responding to decongestions not responding to antibiotics and not responding well to antihistamines. Surgery is now indicated this would also make CPAP use much easier for her.  3) PLMs- frequent and causing arousals. Has been controlled with Requip.   Plan:  Treatment plan and additional workup :  The patient will change her Zoloft intake times to the  AM which usually reduces insomnia, I would like for her to have Ambien as needed available we had a discussion about the frequency of which Lorrin Mais can lead to habit forming problems.  I think she is safe to have Ambien available 3-4 times per week at night.   I like for  her to resume  CPAP several weeks after her sinus and nasal surgery  After clearance by ENT ( Dr. Benjamine Mola),  refit after surgery to a Mask of choice.  Return for CPAP follow up about 6  weeks after surgery scheduled for June 19th 2017 .    [name] Madysin Crisp MD  04/11/2016    CC: Sharilyn Sites, [place], [place] [address]

## 2016-04-12 NOTE — Progress Notes (Signed)
RX for [facility] faxed to [facility]. Received a receipt of confirmation.

## 2016-04-19 ENCOUNTER — Other Ambulatory Visit: Payer: Self-pay | Admitting: Nurse Practitioner

## 2016-04-20 ENCOUNTER — Encounter: Payer: Self-pay | Admitting: Cardiovascular Disease

## 2016-04-20 ENCOUNTER — Encounter: Payer: Self-pay | Admitting: *Deleted

## 2016-04-20 ENCOUNTER — Ambulatory Visit (INDEPENDENT_AMBULATORY_CARE_PROVIDER_SITE_OTHER): Payer: BC Managed Care – PPO | Admitting: Cardiovascular Disease

## 2016-04-20 VITALS — BP 120/80 | HR 74 | Ht 62.0 in | Wt 213.0 lb

## 2016-04-20 DIAGNOSIS — R0602 Shortness of breath: Secondary | ICD-10-CM | POA: Diagnosis not present

## 2016-04-20 DIAGNOSIS — Z9289 Personal history of other medical treatment: Secondary | ICD-10-CM

## 2016-04-20 DIAGNOSIS — I10 Essential (primary) hypertension: Secondary | ICD-10-CM | POA: Diagnosis not present

## 2016-04-20 DIAGNOSIS — R079 Chest pain, unspecified: Secondary | ICD-10-CM

## 2016-04-20 DIAGNOSIS — Z87898 Personal history of other specified conditions: Secondary | ICD-10-CM | POA: Diagnosis not present

## 2016-04-20 NOTE — Patient Instructions (Signed)
Your physician has requested that you have an echocardiogram. Echocardiography is a painless test that uses sound waves to create images of your heart. It provides your doctor with information about the size and shape of your heart and how well your heart's chambers and valves are working. This procedure takes approximately one hour. There are no restrictions for this procedure. Your physician has requested that you have an exercise stress myoview. For further information please visit [email or web address]. Please follow instruction sheet, as given. Office will contact with results via phone or letter.   Continue all current medications. Follow up in  1 month.

## 2016-04-20 NOTE — Progress Notes (Signed)
Patient ID: [name], female   DOB: [birth year], 50 y.o.   MRN: [id number]       CARDIOLOGY CONSULT NOTE  Patient ID: [name] MRN: [id number] DOB/AGE: 08/14/1966 50 y.o.  Admit date: (Not on file) Primary Physician: Purvis Kilts, MD Referring Physician: Hilma Favors MD  Reason for Consultation: chest pain  HPI: The patient is a 50 year old woman with past medical history of obesity, sleep apnea, hypertension, and GERD. She is referred for the evaluation of chest pain. She was evaluated in the ED on 02/27/16 for the same. Troponin was normal. White blood cells were elevated at 12. Basic metabolic panel was unremarkable. BNP was normal. D-dimer was mildly elevated at 0.9.  CT angiography of the chest showed borderline heart size with mild vascular congestion and groundglass opacities in the lower lobes which could reflect atelectasis or early edema. There was no evidence of pulmonary embolism small hiatal hernia.  ECG showed sinus rhythm with no ischemic ST segment or T-wave abnormalities, nor any arrhythmias.  She is a school bus driver. This past Wednesday while she was driving she experienced a burning sensation in the center of her chest which felt much different than her acid reflux symptoms. It was accompanied by nausea and shortness of breath and the pain radiated into her back and into both shoulders. The entire episode lasted 10 minutes. She feels more short of breath at night when lying down. She denies paroxysmal nocturnal dyspnea. She has been using CPAP for sleep apnea since January. She has bad sinus issues and is scheduled for sinus surgery next month.  Allergies  Allergen Reactions  . Penicillins Shortness Of Breath    Current Outpatient Prescriptions  Medication Sig Dispense Refill  . colestipol (COLESTID) 1 g tablet TAKE 1 TABLET BY MOUTH 30 MINUTES BEFORE DINNER. MAY ALSO TAKE 1 TABLET BEFORE BEAKFAST OR LUNCH IF DIARRHEA IS NOT CONTROLLED 60 tablet 3  .  esomeprazole (NEXIUM) 20 MG capsule Take 20 mg by mouth daily before breakfast.    . losartan (COZAAR) 100 MG tablet Take 1 tablet by mouth daily.    Marland Kitchen rOPINIRole (REQUIP) 0.25 MG tablet Take 1 tablet (0.25 mg total) by mouth at bedtime. (Patient taking differently: Take 0.25 mg by mouth daily as needed. ) 30 tablet 0  . sertraline (ZOLOFT) 50 MG tablet Take 50 mg by mouth every morning.    . zolpidem (AMBIEN) 10 MG tablet Take 1 tablet (10 mg total) by mouth at bedtime as needed. Reported on 01/09/2016 30 tablet 1   No current facility-administered medications for this visit.    Past Medical History  Diagnosis Date  . Irritable bowel syndrome   . GERD (gastroesophageal reflux disease)   . HTN (hypertension)   . Cervical atypia, mild 12/2013    Sonohysterogram biopsy showed benign endometrium but inflamed mild atypia and squamous mucosa fragments recommend repeat Pap smear 6 months  . Snoring   . URI (upper respiratory infection)   . Obesity   . Sleep apnea     C PAP    Past Surgical History  Procedure Laterality Date  . Gynecologic cryosurgery    . Cesarean section  10/2003  . Cholecystectomy  2006  . Cervical cone biopsy  1989    CIN 1, ECC positive  . Colonoscopy  2009    Dr. Oneida Alar: frequent sigmoid colon diverticula, small internal hemrorhoids, colonic biopsies to assess for microscopic colitis, Benign     Social History   Social  History  . Marital Status: Married    Spouse Name: N/A  . Number of Children: N/A  . Years of Education: N/A   Occupational History  . teacher assistant [employer] History Main Topics  . Smoking status: Never Smoker   . Smokeless tobacco: Never Used  . Alcohol Use: 0.0 oz/week    0 Standard drinks or equivalent per week     Comment: socially  . Drug Use: No  . Sexual Activity: Yes    Birth Control/ Protection: Other-see comments     Comment: vasectomy-1st intercourse 33 yo-5 partners   Other Topics Concern  . Not  on file   Social History Narrative   Drinks 1 cup of coffee and 12 oz ginger ale.     No family history of premature CAD in 1st degree relatives. Father had MI at age 74.  Prior to Admission medications   Medication Sig Start Date End Date Taking? Authorizing Provider  colestipol (COLESTID) 1 g tablet TAKE 1 TABLET BY MOUTH 30 MINUTES BEFORE DINNER. MAY ALSO TAKE 1 TABLET BEFORE BEAKFAST OR LUNCH IF DIARRHEA IS NOT CONTROLLED 04/20/16  Yes Orvil Feil, NP  esomeprazole (NEXIUM) 20 MG capsule Take 20 mg by mouth daily before breakfast.   Yes Historical Provider, MD  losartan (COZAAR) 100 MG tablet Take 1 tablet by mouth daily. 04/19/16  Yes Historical Provider, MD  rOPINIRole (REQUIP) 0.25 MG tablet Take 1 tablet (0.25 mg total) by mouth at bedtime. Patient taking differently: Take 0.25 mg by mouth daily as needed.  01/09/16  Yes Carmen Dohmeier, MD  sertraline (ZOLOFT) 50 MG tablet Take 50 mg by mouth every morning. 07/29/13  Yes Historical Provider, MD  zolpidem (AMBIEN) 10 MG tablet Take 1 tablet (10 mg total) by mouth at bedtime as needed. Reported on 01/09/2016 04/11/16 05/30/16 Yes Asencion Partridge Dohmeier, MD     Review of systems complete and found to be negative unless listed above in HPI     Physical exam Blood pressure 120/80, pulse 74, height 5\' 2"  (1.575 m), weight 213 lb (96.616 kg), last menstrual period 12/05/2013, SpO2 97 %. General: NAD Neck: No JVD, no thyromegaly or thyroid nodule.  Lungs: Clear to auscultation bilaterally with normal respiratory effort. CV: Nondisplaced PMI. Regular rate and rhythm, normal S1/S2, no S3/S4, no murmur.  No peripheral edema.  No carotid bruit.  Normal pedal pulses.  Abdomen: Soft, nontender, obese. Skin: Intact without lesions or rashes.  Neurologic: Alert and oriented x 3.  Psych: Normal affect. Extremities: No clubbing or cyanosis.  HEENT: Normal.   ECG: Most recent ECG reviewed.  Labs:   Lab Results  Component Value Date   WBC 12.0*  02/27/2016   HGB 13.6 02/27/2016   HCT 43.0 02/27/2016   MCV 92.5 02/27/2016   PLT 331 02/27/2016   No results for input(s): NA, K, CL, CO2, BUN, CREATININE, CALCIUM, PROT, BILITOT, ALKPHOS, ALT, AST, GLUCOSE in the last 168 hours.  Invalid input(s): LABALBU Lab Results  Component Value Date   TROPONINI <0.03 02/27/2016    Lab Results  Component Value Date   CHOL 156 12/03/2013   CHOL 150 06/02/2012   Lab Results  Component Value Date   HDL 39* 12/03/2013   HDL 42 06/02/2012   Lab Results  Component Value Date   LDLCALC 81 12/03/2013   LDLCALC 85 06/02/2012   Lab Results  Component Value Date   TRIG 179* 12/03/2013   TRIG 114 06/02/2012   Lab  Results  Component Value Date   CHOLHDL 4.0 12/03/2013   CHOLHDL 3.6 06/02/2012   No results found for: LDLDIRECT       Studies: No results found.  ASSESSMENT AND PLAN:  1. Chest pain and shortness of breath: Only obvious risk factor for CAD is hypertension. I will proceed with a nuclear myocardial perfusion imaging study (exercise Myoview) to evaluate for ischemic heart disease. I will order a 2-D echocardiogram with Doppler to evaluate cardiac structure, function, and regional wall motion.   2. Essential HTN: Controlled. No changes.  Dispo: fu 1 month.   Signed: Kate Sable, M.D., F.A.C.C.  04/20/2016, 1:49 PM

## 2016-04-27 ENCOUNTER — Other Ambulatory Visit: Payer: Self-pay | Admitting: *Deleted

## 2016-04-27 DIAGNOSIS — R0602 Shortness of breath: Secondary | ICD-10-CM

## 2016-04-27 DIAGNOSIS — R079 Chest pain, unspecified: Secondary | ICD-10-CM

## 2016-05-02 ENCOUNTER — Encounter (HOSPITAL_COMMUNITY): Payer: BC Managed Care – PPO

## 2016-05-02 ENCOUNTER — Ambulatory Visit (HOSPITAL_COMMUNITY)
Admission: RE | Admit: 2016-05-02 | Discharge: 2016-05-02 | Disposition: A | Discharge status: Home / Self Care | Payer: BC Managed Care – PPO | Source: Ambulatory Visit | Attending: Cardiovascular Disease | Admitting: Cardiovascular Disease

## 2016-05-02 ENCOUNTER — Ambulatory Visit (HOSPITAL_COMMUNITY): Payer: BC Managed Care – PPO

## 2016-05-02 DIAGNOSIS — Z6838 Body mass index (BMI) 38.0-38.9, adult: Secondary | ICD-10-CM | POA: Insufficient documentation

## 2016-05-02 DIAGNOSIS — K219 Gastro-esophageal reflux disease without esophagitis: Secondary | ICD-10-CM | POA: Insufficient documentation

## 2016-05-02 DIAGNOSIS — R0602 Shortness of breath: Secondary | ICD-10-CM

## 2016-05-02 DIAGNOSIS — E669 Obesity, unspecified: Secondary | ICD-10-CM | POA: Insufficient documentation

## 2016-05-02 DIAGNOSIS — R079 Chest pain, unspecified: Secondary | ICD-10-CM

## 2016-05-02 DIAGNOSIS — G4733 Obstructive sleep apnea (adult) (pediatric): Secondary | ICD-10-CM | POA: Diagnosis not present

## 2016-05-02 LAB — ECHOCARDIOGRAM COMPLETE
AO mean calculated velocity dopler: 125 cm/s
AV Mean grad: 8 mmHg
AV Peak grad: 15 mmHg
AV area mean vel ind: 0.88 cm2/m2
AV peak Index: 0.84
AV vel: 1.75
AVAREAMEANV: 1.73 cm2
AVAREAVTI: 1.65 cm2
AVAREAVTIIND: 0.89 cm2/m2
AVCELMEANRAT: 0.76
AVLVOTPG: 8 mmHg
AVPKVEL: 193 cm/s
Ao pk vel: 0.73 m/s
CHL CUP AV VALUE AREA INDEX: 0.89
CHL CUP DOP CALC LVOT VTI: 29.8 cm
CHL CUP STROKE VOLUME: 29 mL
EERAT: 10.44
EWDT: 306 ms
FS: 38 % (ref 28–44)
IV/PV OW: 1.01
LA ID, A-P, ES: 36 mm
LA diam index: 1.84 cm/m2
LA vol A4C: 51 ml
LA vol: 47.5 mL
LAVOLIN: 24.2 mL/m2
LDCA: 2.27 cm2
LEFT ATRIUM END SYS DIAM: 36 mm
LV E/e' medial: 10.44
LV E/e'average: 10.44
LV PW d: 9.91 mm — AB (ref 0.6–1.1)
LV TDI E'LATERAL: 8.38
LV dias vol: 42 mL — AB (ref 46–106)
LV e' LATERAL: 8.38 cm/s
LV sys vol: 13 mL — AB (ref 14–42)
LVDIAVOLIN: 21 mL/m2
LVOT SV: 68 mL
LVOT diameter: 17 mm
LVOT peak VTI: 0.77 cm
LVOTPV: 140 cm/s
LVSYSVOLIN: 7 mL/m2
MV Dec: 306
MV pk A vel: 84.8 m/s
MVPG: 3 mmHg
MVPKEVEL: 87.5 m/s
Reg peak vel: 249 cm/s
Simpson's disk: 68
TAPSE: 25.6 mm
TDI e' medial: 7.72
TRMAXVEL: 249 cm/s
VTI: 38.6 cm
Valve area: 1.75 cm2

## 2016-05-07 ENCOUNTER — Encounter (HOSPITAL_BASED_OUTPATIENT_CLINIC_OR_DEPARTMENT_OTHER): Payer: Self-pay | Admitting: *Deleted

## 2016-05-07 ENCOUNTER — Encounter: Payer: Self-pay | Admitting: *Deleted

## 2016-05-07 ENCOUNTER — Ambulatory Visit (HOSPITAL_COMMUNITY)
Admission: RE | Admit: 2016-05-07 | Discharge: 2016-05-07 | Disposition: A | Discharge status: Home / Self Care | Payer: BC Managed Care – PPO | Source: Ambulatory Visit | Attending: Cardiovascular Disease | Admitting: Cardiovascular Disease

## 2016-05-07 DIAGNOSIS — R0602 Shortness of breath: Secondary | ICD-10-CM | POA: Insufficient documentation

## 2016-05-07 DIAGNOSIS — R079 Chest pain, unspecified: Secondary | ICD-10-CM | POA: Diagnosis not present

## 2016-05-07 LAB — EXERCISE TOLERANCE TEST
CHL RATE OF PERCEIVED EXERTION: 13
CSEPED: 8 min
CSEPEDS: 6 s
CSEPEW: 10.4 METS
CSEPHR: 169 %
MPHR: 95 {beats}/min
Peak HR: 162 {beats}/min
Rest HR: 76 {beats}/min

## 2016-05-14 ENCOUNTER — Encounter (HOSPITAL_BASED_OUTPATIENT_CLINIC_OR_DEPARTMENT_OTHER)
Admission: RE | Disposition: A | Discharge status: Home / Self Care | Payer: Self-pay | Source: Ambulatory Visit | Attending: Otolaryngology

## 2016-05-14 ENCOUNTER — Ambulatory Visit (HOSPITAL_BASED_OUTPATIENT_CLINIC_OR_DEPARTMENT_OTHER): Payer: BC Managed Care – PPO | Admitting: Anesthesiology

## 2016-05-14 ENCOUNTER — Encounter (HOSPITAL_BASED_OUTPATIENT_CLINIC_OR_DEPARTMENT_OTHER): Payer: Self-pay | Admitting: *Deleted

## 2016-05-14 ENCOUNTER — Ambulatory Visit (HOSPITAL_BASED_OUTPATIENT_CLINIC_OR_DEPARTMENT_OTHER)
Admission: RE | Admit: 2016-05-14 | Discharge: 2016-05-14 | Disposition: A | Discharge status: Home / Self Care | Payer: BC Managed Care – PPO | Source: Ambulatory Visit | Attending: Otolaryngology | Admitting: Otolaryngology

## 2016-05-14 DIAGNOSIS — J342 Deviated nasal septum: Secondary | ICD-10-CM | POA: Diagnosis not present

## 2016-05-14 DIAGNOSIS — G4730 Sleep apnea, unspecified: Secondary | ICD-10-CM | POA: Insufficient documentation

## 2016-05-14 DIAGNOSIS — J343 Hypertrophy of nasal turbinates: Secondary | ICD-10-CM | POA: Diagnosis not present

## 2016-05-14 DIAGNOSIS — J352 Hypertrophy of adenoids: Secondary | ICD-10-CM | POA: Diagnosis present

## 2016-05-14 DIAGNOSIS — I10 Essential (primary) hypertension: Secondary | ICD-10-CM | POA: Insufficient documentation

## 2016-05-14 DIAGNOSIS — J3489 Other specified disorders of nose and nasal sinuses: Secondary | ICD-10-CM | POA: Insufficient documentation

## 2016-05-14 DIAGNOSIS — K219 Gastro-esophageal reflux disease without esophagitis: Secondary | ICD-10-CM | POA: Insufficient documentation

## 2016-05-14 DIAGNOSIS — J322 Chronic ethmoidal sinusitis: Secondary | ICD-10-CM | POA: Diagnosis not present

## 2016-05-14 HISTORY — PX: SINUS ENDO WITH FUSION: SHX5329

## 2016-05-14 HISTORY — DX: Anxiety disorder, unspecified: F41.9

## 2016-05-14 HISTORY — PX: MAXILLARY ANTROSTOMY: SHX2003

## 2016-05-14 HISTORY — PX: NASAL SEPTOPLASTY W/ TURBINOPLASTY: SHX2070

## 2016-05-14 SURGERY — SEPTOPLASTY, NOSE, WITH NASAL TURBINATE REDUCTION
Anesthesia: General | Site: Nose | Laterality: Right

## 2016-05-14 MED ORDER — CLINDAMYCIN HCL 300 MG PO CAPS: 300.0000 mg | ORAL_CAPSULE | Freq: Three times a day (TID) | ORAL | Status: DC

## 2016-05-14 MED ORDER — EPHEDRINE SULFATE 50 MG/ML IJ SOLN
INTRAMUSCULAR | Status: DC | PRN
  Administered 2016-05-14: 10 mg via INTRAVENOUS

## 2016-05-14 MED ORDER — ONDANSETRON HCL 4 MG/2ML IJ SOLN
INTRAMUSCULAR | Status: AC
  Filled 2016-05-14: qty 2

## 2016-05-14 MED ORDER — LACTATED RINGERS IV SOLN
INTRAVENOUS | Status: DC
  Administered 2016-05-14: 10:00:00 via INTRAVENOUS
  Administered 2016-05-14: 10 mL/h via INTRAVENOUS

## 2016-05-14 MED ORDER — OXYCODONE HCL 5 MG PO TABS
ORAL_TABLET | ORAL | Status: AC
  Filled 2016-05-14: qty 1

## 2016-05-14 MED ORDER — MIDAZOLAM HCL 2 MG/2ML IJ SOLN: 0.5000 mg | Freq: Once | INTRAMUSCULAR | Status: DC | PRN

## 2016-05-14 MED ORDER — FENTANYL CITRATE (PF) 100 MCG/2ML IJ SOLN
INTRAMUSCULAR | Status: AC
  Filled 2016-05-14: qty 2

## 2016-05-14 MED ORDER — HYDROMORPHONE HCL 1 MG/ML IJ SOLN: 0.2500 mg | INTRAMUSCULAR | Status: DC | PRN

## 2016-05-14 MED ORDER — LIDOCAINE-EPINEPHRINE 1 %-1:100000 IJ SOLN
INTRAMUSCULAR | Status: AC
  Filled 2016-05-14: qty 1

## 2016-05-14 MED ORDER — PROPOFOL 10 MG/ML IV BOLUS
INTRAVENOUS | Status: DC | PRN
  Administered 2016-05-14: 200 mg via INTRAVENOUS

## 2016-05-14 MED ORDER — FENTANYL CITRATE (PF) 100 MCG/2ML IJ SOLN
50.0000 ug | INTRAMUSCULAR | Status: AC | PRN
  Administered 2016-05-14: 100 ug via INTRAVENOUS
  Administered 2016-05-14 (×2): 50 ug via INTRAVENOUS

## 2016-05-14 MED ORDER — PROMETHAZINE HCL 25 MG/ML IJ SOLN
6.2500 mg | INTRAMUSCULAR | Status: DC | PRN
Start: 2016-05-14 — End: 2016-05-14

## 2016-05-14 MED ORDER — SUCCINYLCHOLINE CHLORIDE 200 MG/10ML IV SOSY
PREFILLED_SYRINGE | INTRAVENOUS | Status: AC
  Filled 2016-05-14: qty 10

## 2016-05-14 MED ORDER — MEPERIDINE HCL 25 MG/ML IJ SOLN: 6.2500 mg | INTRAMUSCULAR | Status: DC | PRN

## 2016-05-14 MED ORDER — LIDOCAINE HCL (CARDIAC) 20 MG/ML IV SOLN
INTRAVENOUS | Status: DC | PRN
  Administered 2016-05-14: 100 mg via INTRAVENOUS

## 2016-05-14 MED ORDER — GLYCOPYRROLATE 0.2 MG/ML IJ SOLN: 0.2000 mg | Freq: Once | INTRAMUSCULAR | Status: DC | PRN

## 2016-05-14 MED ORDER — PROPOFOL 10 MG/ML IV BOLUS
INTRAVENOUS | Status: AC
  Filled 2016-05-14: qty 20

## 2016-05-14 MED ORDER — MIDAZOLAM HCL 2 MG/2ML IJ SOLN
1.0000 mg | INTRAMUSCULAR | Status: DC | PRN
  Administered 2016-05-14: 2 mg via INTRAVENOUS

## 2016-05-14 MED ORDER — OXYCODONE HCL 5 MG PO TABS
5.0000 mg | ORAL_TABLET | Freq: Once | ORAL | Status: AC | PRN
  Administered 2016-05-14: 5 mg via ORAL

## 2016-05-14 MED ORDER — DEXAMETHASONE SODIUM PHOSPHATE 4 MG/ML IJ SOLN
INTRAMUSCULAR | Status: DC | PRN
  Administered 2016-05-14: 10 mg via INTRAVENOUS

## 2016-05-14 MED ORDER — SCOPOLAMINE 1 MG/3DAYS TD PT72
MEDICATED_PATCH | TRANSDERMAL | Status: AC
  Filled 2016-05-14: qty 1

## 2016-05-14 MED ORDER — LIDOCAINE-EPINEPHRINE 1 %-1:100000 IJ SOLN
INTRAMUSCULAR | Status: DC | PRN
  Administered 2016-05-14: 3.5 mL

## 2016-05-14 MED ORDER — LIDOCAINE 2% (20 MG/ML) 5 ML SYRINGE
INTRAMUSCULAR | Status: AC
  Filled 2016-05-14: qty 5

## 2016-05-14 MED ORDER — MIDAZOLAM HCL 2 MG/2ML IJ SOLN
INTRAMUSCULAR | Status: AC
  Filled 2016-05-14: qty 2

## 2016-05-14 MED ORDER — CLINDAMYCIN PHOSPHATE 900 MG/50ML IV SOLN
INTRAVENOUS | Status: DC | PRN
  Administered 2016-05-14: 900 mg via INTRAVENOUS

## 2016-05-14 MED ORDER — ONDANSETRON HCL 4 MG/2ML IJ SOLN
INTRAMUSCULAR | Status: DC | PRN
  Administered 2016-05-14: 4 mg via INTRAVENOUS

## 2016-05-14 MED ORDER — CLINDAMYCIN PHOSPHATE 900 MG/50ML IV SOLN
INTRAVENOUS | Status: AC
  Filled 2016-05-14: qty 50

## 2016-05-14 MED ORDER — SCOPOLAMINE 1 MG/3DAYS TD PT72: 1.0000 | MEDICATED_PATCH | Freq: Once | TRANSDERMAL | Status: DC | PRN

## 2016-05-14 MED ORDER — OXYCODONE-ACETAMINOPHEN 5-325 MG PO TABS
1.0000 | ORAL_TABLET | ORAL | Status: DC | PRN
Start: 2016-05-14 — End: 2016-06-12

## 2016-05-14 MED ORDER — SCOPOLAMINE 1 MG/3DAYS TD PT72
1.0000 | MEDICATED_PATCH | Freq: Once | TRANSDERMAL | Status: DC
  Administered 2016-05-14: 1.5 mg via TRANSDERMAL

## 2016-05-14 MED ORDER — MUPIROCIN 2 % EX OINT
TOPICAL_OINTMENT | CUTANEOUS | Status: AC
  Filled 2016-05-14: qty 22

## 2016-05-14 MED ORDER — DEXAMETHASONE SODIUM PHOSPHATE 10 MG/ML IJ SOLN
INTRAMUSCULAR | Status: AC
  Filled 2016-05-14: qty 1

## 2016-05-14 MED ORDER — OXYMETAZOLINE HCL 0.05 % NA SOLN
NASAL | Status: DC | PRN
  Administered 2016-05-14: 1

## 2016-05-14 MED ORDER — SUCCINYLCHOLINE CHLORIDE 20 MG/ML IJ SOLN
INTRAMUSCULAR | Status: DC | PRN
  Administered 2016-05-14: 120 mg via INTRAVENOUS

## 2016-05-14 MED ORDER — MUPIROCIN 2 % EX OINT
TOPICAL_OINTMENT | CUTANEOUS | Status: DC | PRN
Start: 2016-05-14 — End: 2016-05-14
  Administered 2016-05-14: 1 via NASAL

## 2016-05-14 MED ORDER — OXYMETAZOLINE HCL 0.05 % NA SOLN
NASAL | Status: AC
  Filled 2016-05-14: qty 15

## 2016-05-14 SURGICAL SUPPLY — 67 items
ATTRACTOMAT 16X20 MAGNETIC DRP (DRAPES) IMPLANT
BLADE RAD40 ROTATE 4M 4 5PK (BLADE) IMPLANT
BLADE RAD40 ROTATE 4M 4MM 5PK (BLADE)
BLADE RAD60 ROTATE M4 4 5PK (BLADE) IMPLANT
BLADE RAD60 ROTATE M4 4MM 5PK (BLADE)
BLADE ROTATE RAD 12 4 M4 (BLADE) IMPLANT
BLADE ROTATE RAD 12 4MM M4 (BLADE)
BLADE ROTATE RAD 40 4 M4 (BLADE) IMPLANT
BLADE ROTATE RAD 40 4MM M4 (BLADE)
BLADE ROTATE TRICUT 4MX13CM M4 (BLADE) ×1
BLADE ROTATE TRICUT 4X13 M4 (BLADE) ×4 IMPLANT
BLADE TRICUT ROTATE M4 4 5PK (BLADE) IMPLANT
BLADE TRICUT ROTATE M4 4MM 5PK (BLADE)
BUR HS RAD FRONTAL 3 (BURR) IMPLANT
BUR HS RAD FRONTAL 3MM (BURR)
CANISTER SUC SOCK COL 7IN (MISCELLANEOUS) ×10 IMPLANT
CANISTER SUCT 1200ML W/VALVE (MISCELLANEOUS) ×5 IMPLANT
COAGULATOR SUCT 6 FR SWTCH (ELECTROSURGICAL)
COAGULATOR SUCT 8FR VV (MISCELLANEOUS) ×5 IMPLANT
COAGULATOR SUCT SWTCH 10FR 6 (ELECTROSURGICAL) IMPLANT
DECANTER SPIKE VIAL GLASS SM (MISCELLANEOUS) IMPLANT
DRSG NASAL KENNEDY LMNT 8CM (GAUZE/BANDAGES/DRESSINGS) IMPLANT
DRSG NASOPORE 8CM (GAUZE/BANDAGES/DRESSINGS) IMPLANT
DRSG TELFA 3X8 NADH (GAUZE/BANDAGES/DRESSINGS) IMPLANT
ELECT REM PT RETURN 9FT ADLT (ELECTROSURGICAL) ×5
ELECTRODE REM PT RTRN 9FT ADLT (ELECTROSURGICAL) ×3 IMPLANT
GLOVE BIO SURGEON STRL SZ7.5 (GLOVE) ×5 IMPLANT
GLOVE SURG SS PI 7.0 STRL IVOR (GLOVE) ×2 IMPLANT
GOWN STRL REUS W/ TWL LRG LVL3 (GOWN DISPOSABLE) ×6 IMPLANT
GOWN STRL REUS W/TWL LRG LVL3 (GOWN DISPOSABLE) ×10
HEMOSTAT SURGICEL 2X14 (HEMOSTASIS) IMPLANT
IV NS 1000ML (IV SOLUTION)
IV NS 1000ML BAXH (IV SOLUTION) IMPLANT
IV NS 500ML (IV SOLUTION) ×5
IV NS 500ML BAXH (IV SOLUTION) ×6 IMPLANT
IV SET EXT 30 76VOL 4 MALE LL (IV SETS) IMPLANT
NDL HYPO 25X1 1.5 SAFETY (NEEDLE) ×3 IMPLANT
NDL SPNL 25GX3.5 QUINCKE BL (NEEDLE) IMPLANT
NEEDLE HYPO 25X1 1.5 SAFETY (NEEDLE) ×5 IMPLANT
NEEDLE SPNL 25GX3.5 QUINCKE BL (NEEDLE) IMPLANT
NS IRRIG 1000ML POUR BTL (IV SOLUTION) ×5 IMPLANT
PACK BASIN DAY SURGERY FS (CUSTOM PROCEDURE TRAY) ×5 IMPLANT
PACK ENT DAY SURGERY (CUSTOM PROCEDURE TRAY) ×5 IMPLANT
PACKING NASAL EPIS 4X2.4 XEROG (MISCELLANEOUS) IMPLANT
PACKING NASAL EPISTAXIS 4X2.4 (MISCELLANEOUS) ×2 IMPLANT
PAD DRESSING TELFA 3X8 NADH (GAUZE/BANDAGES/DRESSINGS) IMPLANT
SLEEVE SCD COMPRESS KNEE MED (MISCELLANEOUS) IMPLANT
SOLUTION BUTLER CLEAR DIP (MISCELLANEOUS) ×10 IMPLANT
SPLINT NASAL AIRWAY SILICONE (MISCELLANEOUS) IMPLANT
SPONGE GAUZE 2X2 8PLY STER LF (GAUZE/BANDAGES/DRESSINGS) ×1
SPONGE GAUZE 2X2 8PLY STRL LF (GAUZE/BANDAGES/DRESSINGS) ×4 IMPLANT
SPONGE NEURO XRAY DETECT 1X3 (DISPOSABLE) ×5 IMPLANT
SUT CHROMIC 4 0 P 3 18 (SUTURE) ×5 IMPLANT
SUT ETHILON 3 0 PS 1 (SUTURE) ×2 IMPLANT
SUT PLAIN 4 0 ~~LOC~~ 1 (SUTURE) ×5 IMPLANT
SUT PROLENE 3 0 PS 2 (SUTURE) ×5 IMPLANT
SUT VIC AB 4-0 P-3 18XBRD (SUTURE) IMPLANT
SUT VIC AB 4-0 P3 18 (SUTURE)
TOWEL OR 17X24 6PK STRL BLUE (TOWEL DISPOSABLE) ×5 IMPLANT
TRACKER ENT INSTRUMENT (MISCELLANEOUS) ×5 IMPLANT
TRACKER ENT PATIENT (MISCELLANEOUS) ×5 IMPLANT
TUBE CONNECTING 20'X1/4 (TUBING) ×1
TUBE CONNECTING 20X1/4 (TUBING) ×4 IMPLANT
TUBE SALEM SUMP 12R W/ARV (TUBING) IMPLANT
TUBE SALEM SUMP 16 FR W/ARV (TUBING) ×5 IMPLANT
TUBING STRAIGHTSHOT EPS 5PK (TUBING) ×5 IMPLANT
YANKAUER SUCT BULB TIP NO VENT (SUCTIONS) ×5 IMPLANT

## 2016-05-14 NOTE — Op Note (Signed)
DATE OF PROCEDURE: 05/14/2016  OPERATIVE REPORT   SURGEON: Leta Baptist, MD   PREOPERATIVE DIAGNOSES:  1. Severe nasal septal deviation.  2. Bilateral inferior turbinate hypertrophy.  3. Chronic nasal obstruction. 4. Left antrochoanal polyp. 5. 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  POSTOPERATIVE DIAGNOSES:  1. Severe nasal septal deviation.  2. Bilateral inferior turbinate hypertrophy.  3. Chronic nasal obstruction. 4. Left antrochoanal polyp. 5. 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  PROCEDURE PERFORMED:  1. Endoscopic right frontal sinusotomy  2. Septoplasty.  3. Bilateral partial inferior turbinate resection. 4. Endoscopic right total ethmoidectomy 5. Endoscopic left maxillary antrostomy with polyp removal 6. FUSION stereotactic image guidance   ANESTHESIA: General endotracheal tube anesthesia.   COMPLICATIONS: None.   ESTIMATED BLOOD LOSS: Less than 200 mL.   INDICATION FOR PROCEDURE: [name] is a 50 y.o. female with a history of chronic nasal obstruction and recurrent sinusitis. The patient was  treated with multiple courses of antibiotics, antihistamine, decongestant, steroid nasal spray, and systemic steroids. However, the patient continues to be symptomatic. On examination, the patient was noted to have bilateral severe inferior turbinate hypertrophy and significant nasal septal deviation, causing significant nasal obstruction. In addition, a large left antrochoanal polyp was also noted. On her CT scan, the patient was noted to have opacification of her left maxillary, right ethmoid, and the right frontal recess. Based on the above findings, the decision was made for the patient to undergo the above-stated procedures. The risks, benefits, alternatives, and details of the procedure were discussed with the patient. Questions were invited and answered. Informed consent was obtained.   DESCRIPTION OF PROCEDURE: The patient was  taken to the operating room and placed supine on the operating table. General endotracheal tube anesthesia was administered by the anesthesiologist. The patient was positioned, and prepped and draped in the standard fashion for nasal surgery. Pledgets soaked with Afrin were placed in both nasal cavities for decongestion. The pledgets were subsequently removed. The above mentioned severe septal deviation was again noted. 1% lidocaine with 1:100,000 epinephrine was injected onto the nasal septum bilaterally. A hemitransfixion incision was made on the left side. The mucosal flap was carefully elevated on the left side. A cartilaginous incision was made 1 cm superior to the caudal margin of the nasal septum. Mucosal flap was also elevated on the right side in the similar fashion. It should be noted that due to the severe septal deviation, the deviated portion of the cartilaginous and bony septum had to be removed in piecemeal fashion. Once the deviated portions were removed, a straight midline septum was achieved. The septum was then quilted with 4-0 plain gut sutures. The hemitransfixion incision was closed with interrupted 4-0 chromic sutures.  Attention was then focused on the paranasal sinuses. Using a 0 scope, the left nasal cavity was examined. A large antrochoanal polyp was noted on the left side. The polyp was removed using a combination of Blakesley forceps, Tru-Cut forceps, and microdebrider. The left uncinate process was then resected with a freer elevator. The maxillary antrum was entered and enlarged using a combination of backbiter, microdebrider, and Tru-Cut forceps. Polypoid tissue was also removed from the left maxillary sinus.  Attention was then focused on the right paranasal sinuses. The right middle turbinate was carefully medialized. Polypoid tissue was noted within the right frontal recess and the right ethmoid sinuses. The bony partitions of the anterior and the posterior ethmoid cavities  were taken down. The right frontal recess was also enlarged  using a combination of Tru-Cut forceps and microdebrider. The paranasal sinuses were copiously irrigated.  The inferior one half of both hypertrophied inferior turbinate was crossclamped with a Kelly clamp. The inferior one half of each inferior turbinate was then resected with a pair of cross cutting scissors. Hemostasis was achieved with a suction cautery device. Doyle splints were applied to the nasal septum bilaterally.  The care of the patient was turned over to the anesthesiologist. The patient was awakened from anesthesia without difficulty. The patient was extubated and transferred to the recovery room in good condition.   OPERATIVE FINDINGS: Severe nasal septal deviation and bilateral inferior turbinate hypertrophy. Left antrochoanal polyp. Chronic left maxillary sinusitis, chronic right ethmoid and frontal sinusitis.  SPECIMEN: Bilateral sinus contents.  FOLLOWUP CARE: The patient be discharged home once she is awake and alert. The patient will be placed on Percocet 1-2 tablets p.o. q.6 hours p.r.n. pain, and clindamycin 300 mg by mouth 3 times a day for 5 days. The patient will follow up in my office in approximately 1 week for splint removal.   [name], MD

## 2016-05-14 NOTE — Anesthesia Procedure Notes (Signed)
Procedure Name: Intubation Date/Time: 05/14/2016 9:26 AM Performed by: Lieutenant Diego Pre-anesthesia Checklist: Patient identified, Emergency Drugs available, Suction available and Patient being monitored Patient Re-evaluated:Patient Re-evaluated prior to inductionOxygen Delivery Method: Circle system utilized Preoxygenation: Pre-oxygenation with 100% oxygen Intubation Type: IV induction Ventilation: Mask ventilation without difficulty Laryngoscope Size: Miller and 2 Grade View: Grade I Tube type: Oral Tube size: 7.0 mm Number of attempts: 1 Airway Equipment and Method: Stylet and Oral airway Placement Confirmation: ETT inserted through vocal cords under direct vision,  positive ETCO2 and breath sounds checked- equal and bilateral Secured at: 22 cm Tube secured with: Tape Dental Injury: Teeth and Oropharynx as per pre-operative assessment

## 2016-05-14 NOTE — Transfer of Care (Signed)
Immediate Anesthesia Transfer of Care Note  Patient: [name]  Procedure(s) Performed: Procedure(s) with comments: NASAL SEPTOPLASTY WITH TURBINATE REDUCTION (Bilateral) - NASAL SEPTOPLASTY WITH TURBINATE REDUCTION RIGHT ENDOSCOPIC  ETHMOIDECTOMY AND RIGHT ENDOSCOPIC FRONTAL SINUS EXPLORATION (Right) - RIGHT ENDOSCOPIC  ETHMOIDECTOMY AND RIGHT ENDOSCOPIC FRONTAL SINUS EXPLORATION LEFT ENDOSCOPIC MAXILLARY ANTROSTOMY WITH FUSION NAVIGATION (Left) - LEFT ENDOSCOPIC MAXILLARY ANTROSTOMY WITH FUSION NAVIGATION  Patient Location: PACU  Anesthesia Type:General  Level of Consciousness: awake and alert   Airway & Oxygen Therapy: Patient Spontanous Breathing and Patient connected to face mask oxygen  Post-op Assessment: Report given to RN and Post -op Vital signs reviewed and stable  Post vital signs: Reviewed and stable  Last Vitals:  Filed Vitals:   05/14/16 0843  BP: 116/86  Pulse: 83  Temp: 36.5 C  Resp: 18    Last Pain: There were no vitals filed for this visit.       Complications: No apparent anesthesia complications

## 2016-05-14 NOTE — Anesthesia Preprocedure Evaluation (Addendum)
Anesthesia Evaluation  Patient identified by MRN, date of birth, ID band Patient awake    Reviewed: Allergy & Precautions, NPO status , Patient's Chart, lab work & pertinent test results  History of Anesthesia Complications Negative for: history of anesthetic complications  Airway Mallampati: III  TM Distance: >3 FB Neck ROM: Full    Dental  (+) Dental Advisory Given   Pulmonary sleep apnea and Continuous Positive Airway Pressure Ventilation ,    breath sounds clear to auscultation       Cardiovascular hypertension, Pt. on medications (-) angina Rhythm:Regular Rate:Normal  05/02/16 ECHO: EF 60-65%, mild TR 05/07/16 Stress test: no ischemia, low risk   Neuro/Psych Anxiety negative neurological ROS     GI/Hepatic Neg liver ROS, GERD  Medicated and Controlled,  Endo/Other  Morbid obesity  Renal/GU negative Renal ROS     Musculoskeletal   Abdominal (+) + obese,   Peds  Hematology negative hematology ROS (+)   Anesthesia Other Findings   Reproductive/Obstetrics                           Anesthesia Physical Anesthesia Plan  ASA: III  Anesthesia Plan: General   Post-op Pain Management:    Induction: Intravenous  Airway Management Planned: Oral ETT  Additional Equipment:   Intra-op Plan:   Post-operative Plan: Extubation in OR  Informed Consent: I have reviewed the patients History and Physical, chart, labs and discussed the procedure including the risks, benefits and alternatives for the proposed anesthesia with the patient or authorized representative who has indicated his/her understanding and acceptance.   Dental advisory given  Plan Discussed with: CRNA and Surgeon  Anesthesia Plan Comments: (Plan routine monitors, GETA)        Anesthesia Quick Evaluation

## 2016-05-14 NOTE — H&P (Signed)
Cc: Chronic rhinosinusitis, nasal obstruction  HPI: The patient is a 50 year old female who returns today for her follow-up evaluation.  She was last seen 1 week ago.  At that time, she was noted to have purulent drainage and polyposis at the left middle meatus.  She was also noted to have severe right septal deviation and bilateral inferior turbinate hypertrophy.  The patient was treated with levofloxacin and Flonase nasal spray.  The patient underwent a paranasal sinus CT scan.  The CT showed opacification of her left maxillary sinus, and right frontal and ethmoid sinuses.  In addition, significant nasal septal deviation and bilateral inferior turbinate hypertrophy are also noted on the CT scan. The patient returns reporting no improvement in her symptoms.  She continues to complain of persistent facial pain and pressure and severe nasal obstruction. She is now a habitual mouth breather.  She was treated with 5 course of antibiotics without improvement in her symptoms. She is interested in more definitive intervention to treat her infection.   Exam: The nasal cavities were decongested and anesthetised with a combination of oxymetazoline and 4% lidocaine solution.  The flexible scope was inserted into the right nasal cavity. NSD with spur. Endoscopy of the inferior and middle meatus was performed. Edematous mucosa was noted.  No polyp, mass, or lesion was appreciated.  Olfactory cleft was clear.  Nasopharynx was clear.  Turbinates were hypertrophied but without mass.  Incomplete response to decongestion.  The procedure was repeated on the contralateral side with drainage and polyp noted at the middle meatus.  The patient tolerated the procedure well.  Instructions were given to avoid eating or drinking for 2 hours.  Assessment: 1.  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   2.  Severe septal deviation and bilateral inferior turbinate hypertrophy.   3.  More than 95% of her nasal passageways are obstructed bilaterally.   Plan: 1.  The nasal endoscopy findings and the CT images are reviewed with the patient.  2.  The patient should continue with her Flonase nasal spray.  She should complete her levofloxacin antibiotic regimen.   3.  In light of the above findings, she may benefit from surgical intervention with septoplasty, turbinate reduction, and endoscopic sinus surgery to enlarge the openings of her left maxillary and her right frontal and ethmoid openings.   4.  The risks, benefits, alternatives and details of the procedures are reviewed.  Questions are invited and answered.  5.  The patient would like to proceed with the procedures.

## 2016-05-14 NOTE — Discharge Instructions (Addendum)
POSTOPERATIVE INSTRUCTIONS FOR PATIENTS HAVING NASAL OR SINUS OPERATIONS ACTIVITY: Restrict activity at home for the first two days, resting as much as possible. Light activity is best. You may usually return to work within a week. You should refrain from nose blowing, strenuous activity, or heavy lifting greater than 20lbs for a total of three weeks after your operation.  If sneezing cannot be avoided, sneeze with your mouth open. DISCOMFORT: You may experience a dull headache and pressure along with nasal congestion and discharge. These symptoms may be worse during the first week after the operation but may last as long as two to four weeks.  Please take Tylenol or the pain medication that has been prescribed for you. Do not take aspirin or aspirin containing medications since they may cause bleeding.  You may experience symptoms of post nasal drainage, nasal congestion, headaches and fatigue for two or three months after your operation.  BLEEDING: You may have some blood tinged nasal drainage for approximately two weeks after the operation.  The discharge will be worse for the first week.  Please call our office at [phone] or go to the nearest hospital emergency room if you experience any of the following: heavy, bright red blood from your nose or mouth that lasts longer than ten minutes or coughing up or vomiting bright red blood or blood clots. GENERAL CONSIDERATIONS: 1. A gauze dressing will be placed on your upper lip to absorb any drainage after the operation. You may need to change this several times a day.  If you do not have very much drainage, you may remove the dressing.  Remember that you may gently wipe your nose with a tissue and sniff in, but DO NOT blow your nose. 2. Please keep all of your postoperative appointments.  Your final results after the operation will depend on proper follow-up.  The initial visit is usually four to seven days after the operation.  During this visit, the  remaining nasal packing and internal septal splints will be removed.  Your nasal and sinus cavities will be cleaned.  During the second visit, your nasal and sinus cavities will be cleaned again. Have someone drive you to your first two postoperative appointments. We suggest that you take your prescribed pain medication about  hour prior to each of these two appointments.  3. How you care for your nose after the operation will influence the results that you obtain.  You should follow all directions, take your medication as prescribed, and call our office [phone] with any problems or questions. 4. You may be more comfortable sleeping with your head elevated on two pillows. 5. Do not take any medications that we have not prescribed or recommended. WARNING SIGNS: if any of the following should occur, please call our office: 1. Bright red bleeding which lasts more than 10 minutes. 2. Persistent fever greater than 102F. 3. Persistent vomiting. 4. Severe and constant pain that is not relieved by prescribed pain medication. 5. Trauma to the nose. 6. Rash or unusual side effects from any medicines.  Post Anesthesia Home Care Instructions  Activity: Get plenty of rest for the remainder of the day. A responsible adult should stay with you for 24 hours following the procedure.  For the next 24 hours, DO NOT: -Drive a car -Paediatric nurse -Drink alcoholic beverages -Take any medication unless instructed by your physician -Make any legal decisions or sign important papers.  Meals: Start with liquid foods such as gelatin or soup. Progress to regular  foods as tolerated. Avoid greasy, spicy, heavy foods. If nausea and/or vomiting occur, drink only clear liquids until the nausea and/or vomiting subsides. Call your physician if vomiting continues.  Special Instructions/Symptoms: Your throat may feel dry or sore from the anesthesia or the breathing tube placed in your throat during surgery. If this  causes discomfort, gargle with warm salt water. The discomfort should disappear within 24 hours.  If you had a scopolamine patch placed behind your ear for the management of post- operative nausea and/or vomiting:  1. The medication in the patch is effective for 72 hours, after which it should be removed.  Wrap patch in a tissue and discard in the trash. Wash hands thoroughly with soap and water. 2. You may remove the patch earlier than 72 hours if you experience unpleasant side effects which may include dry mouth, dizziness or visual disturbances. 3. Avoid touching the patch. Wash your hands with soap and water after contact with the patch.     CPAP use is required and essential  At home.

## 2016-05-14 NOTE — Anesthesia Postprocedure Evaluation (Signed)
Anesthesia Post Note  Patient: [name]  Procedure(s) Performed: Procedure(s) (LRB): NASAL SEPTOPLASTY WITH TURBINATE REDUCTION (Bilateral) RIGHT ENDOSCOPIC  ETHMOIDECTOMY AND RIGHT ENDOSCOPIC FRONTAL SINUS EXPLORATION (Right) LEFT ENDOSCOPIC MAXILLARY ANTROSTOMY WITH FUSION NAVIGATION (Left)  Patient location during evaluation: PACU Anesthesia Type: General Level of consciousness: awake and alert Pain management: pain level controlled Vital Signs Assessment: post-procedure vital signs reviewed and stable Respiratory status: spontaneous breathing, nonlabored ventilation, respiratory function stable and patient connected to nasal cannula oxygen Cardiovascular status: blood pressure returned to baseline and stable Postop Assessment: no signs of nausea or vomiting Anesthetic complications: no    Last Vitals:  Filed Vitals:   05/14/16 1255 05/14/16 1300  BP:  148/90  Pulse: 89 84  Temp:  36.7 C  Resp:  18    Last Pain:  Filed Vitals:   05/14/16 1307  PainSc: 3                  Ayat Drenning DAVID

## 2016-05-15 ENCOUNTER — Encounter (HOSPITAL_BASED_OUTPATIENT_CLINIC_OR_DEPARTMENT_OTHER): Payer: Self-pay | Admitting: Otolaryngology

## 2016-05-15 NOTE — Addendum Note (Signed)
Addendum  created 05/15/16 1313 by Tawni Millers, CRNA   Modules edited: Charges VN

## 2016-05-21 ENCOUNTER — Other Ambulatory Visit: Payer: Self-pay | Admitting: Neurology

## 2016-05-21 ENCOUNTER — Telehealth: Payer: Self-pay | Admitting: Neurology

## 2016-05-21 NOTE — Telephone Encounter (Signed)
I spoke to pt. She reports that she is still having pain and numbness after her nasal surgery. She did not discuss starting cpap again with Dr. Benjamine Mola at her follow up appt with him yesterday but noticed on the after visit summary that he provided her, that it tells her that to use her cpap. Pt is concerned about using her cpap when she is still having pain and numbness but does not want her insurance to not pay for her cpap if she doesn't use it for a few weeks while she recovers. Pt has another appt with Dr. Benjamine Mola in 2 weeks. Per Dr. Edwena Felty last note, "I like for her to resume CPAP several weeks after her sinus and nasal surgery After clearance by ENT ( Dr. Benjamine Mola),"   I advised pt that per Dr. Edwena Felty note, she says that it is ok for pt to resume cpap several weeks after her nasal surgery. She needs to get explicit clearance from Dr. Benjamine Mola. Pt says that due to the pain from the surgery, she will wait until her follow up in 2 weeks with Dr. Benjamine Mola to discuss resuming her cpap with him then.

## 2016-05-21 NOTE — Telephone Encounter (Signed)
Pt called said she had septoplasty and sinus surgery last week. Said Dr Dohmeier told her not to use CPAP for 4 weeks, not to use during the healing process.  ENT said to continue to use CPAP at home. Please call to clarify.

## 2016-05-22 ENCOUNTER — Ambulatory Visit: Payer: BC Managed Care – PPO | Admitting: Cardiovascular Disease

## 2016-05-23 ENCOUNTER — Ambulatory Visit: Payer: BC Managed Care – PPO | Admitting: Cardiovascular Disease

## 2016-06-04 ENCOUNTER — Ambulatory Visit (INDEPENDENT_AMBULATORY_CARE_PROVIDER_SITE_OTHER): Payer: BC Managed Care – PPO | Admitting: Otolaryngology

## 2016-06-04 DIAGNOSIS — J322 Chronic ethmoidal sinusitis: Secondary | ICD-10-CM

## 2016-06-04 DIAGNOSIS — J320 Chronic maxillary sinusitis: Secondary | ICD-10-CM

## 2016-06-04 DIAGNOSIS — J321 Chronic frontal sinusitis: Secondary | ICD-10-CM | POA: Diagnosis not present

## 2016-06-12 ENCOUNTER — Ambulatory Visit (INDEPENDENT_AMBULATORY_CARE_PROVIDER_SITE_OTHER): Payer: BC Managed Care – PPO | Admitting: Cardiovascular Disease

## 2016-06-12 ENCOUNTER — Encounter: Payer: Self-pay | Admitting: Cardiovascular Disease

## 2016-06-12 VITALS — BP 125/84 | HR 99 | Ht 62.0 in | Wt 218.0 lb

## 2016-06-12 DIAGNOSIS — I10 Essential (primary) hypertension: Secondary | ICD-10-CM

## 2016-06-12 DIAGNOSIS — R0602 Shortness of breath: Secondary | ICD-10-CM | POA: Diagnosis not present

## 2016-06-12 DIAGNOSIS — R079 Chest pain, unspecified: Secondary | ICD-10-CM

## 2016-06-12 NOTE — Progress Notes (Signed)
Patient ID: [name], female   DOB: [birth year], 50 y.o.   MRN: [id number]      SUBJECTIVE: The patient returns for follow-up after undergoing cardiovascular testing performed for the evaluation of chest pain. She underwent a low risk exercise treadmill stress test on 05/07/16. There was a hypertensive response to exercise. Exercise capacity was normal. Echocardiogram demonstrated normal cardiac function, EF 60-65%.  Has had no further episodes of chest pain.   Review of Systems: As per "subjective", otherwise negative.  Allergies  Allergen Reactions  . Penicillins Shortness Of Breath    Current Outpatient Prescriptions  Medication Sig Dispense Refill  . colestipol (COLESTID) 1 g tablet TAKE 1 TABLET BY MOUTH 30 MINUTES BEFORE DINNER. MAY ALSO TAKE 1 TABLET BEFORE BEAKFAST OR LUNCH IF DIARRHEA IS NOT CONTROLLED 60 tablet 3  . esomeprazole (NEXIUM) 20 MG capsule Take 20 mg by mouth daily before breakfast.    . losartan (COZAAR) 100 MG tablet Take 1 tablet by mouth daily.    Marland Kitchen rOPINIRole (REQUIP) 0.25 MG tablet TAKE 1 TABLET BY MOUTH EVERY NIGHT AT BEDTIME 30 tablet 0  . sertraline (ZOLOFT) 50 MG tablet Take 50 mg by mouth every morning.    . zolpidem (AMBIEN) 10 MG tablet Take 10 mg by mouth at bedtime as needed for sleep.     No current facility-administered medications for this visit.    Past Medical History  Diagnosis Date  . Irritable bowel syndrome   . GERD (gastroesophageal reflux disease)   . HTN (hypertension)   . Cervical atypia, mild 12/2013    Sonohysterogram biopsy showed benign endometrium but inflamed mild atypia and squamous mucosa fragments recommend repeat Pap smear 6 months  . Snoring   . URI (upper respiratory infection)   . Obesity   . Sleep apnea     C PAP  . Anxiety     Past Surgical History  Procedure Laterality Date  . Gynecologic cryosurgery    . Cesarean section  10/2003  . Cholecystectomy  2006  . Cervical cone biopsy  1989    CIN 1,  ECC positive  . Colonoscopy  2009    Dr. Oneida Alar: frequent sigmoid colon diverticula, small internal hemrorhoids, colonic biopsies to assess for microscopic colitis, Benign   . Nasal septoplasty w/ turbinoplasty Bilateral 05/14/2016    Procedure: NASAL SEPTOPLASTY WITH TURBINATE REDUCTION;  Surgeon: Leta Baptist, MD;  Location: [facility];  Service: ENT;  Laterality: Bilateral;  NASAL SEPTOPLASTY WITH TURBINATE REDUCTION  . Sinus endo with fusion Right 05/14/2016    Procedure: RIGHT ENDOSCOPIC  ETHMOIDECTOMY AND RIGHT ENDOSCOPIC FRONTAL SINUS EXPLORATION;  Surgeon: Leta Baptist, MD;  Location: [facility];  Service: ENT;  Laterality: Right;  RIGHT ENDOSCOPIC  ETHMOIDECTOMY AND RIGHT ENDOSCOPIC FRONTAL SINUS EXPLORATION  . Maxillary antrostomy Left 05/14/2016    Procedure: LEFT ENDOSCOPIC MAXILLARY ANTROSTOMY WITH FUSION NAVIGATION;  Surgeon: Leta Baptist, MD;  Location: [facility];  Service: ENT;  Laterality: Left;  LEFT ENDOSCOPIC MAXILLARY ANTROSTOMY WITH FUSION NAVIGATION    Social History   Social History  . Marital Status: Married    Spouse Name: N/A  . Number of Children: N/A  . Years of Education: N/A   Occupational History  . 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 Smoking status: Never Smoker   . Smokeless tobacco: Never Used  . Alcohol Use: 0.0 oz/week    0 Standard drinks or equivalent per week  Comment: socially  . Drug Use: No  . Sexual Activity: Yes    Birth Control/ Protection: Other-see comments     Comment: vasectomy-1st intercourse 71 yo-5 partners   Other Topics Concern  . Not on file   Social History Narrative   Drinks 1 cup of coffee and 12 oz ginger ale.     Filed Vitals:   06/12/16 1308  BP: 125/84  Pulse: 99  Height: 5\' 2"  (1.575 m)  Weight: 218 lb (98.884 kg)    PHYSICAL EXAM General: NAD HEENT: Normal. Neck: No JVD, no thyromegaly. Lungs: Clear to auscultation bilaterally with normal  respiratory effort. CV: Nondisplaced PMI.  Regular rate and rhythm, normal S1/S2, no S3/S4, no murmur. No pretibial or periankle edema.  No carotid bruit.   Abdomen: Soft, nontender, obese. Neurologic: Alert and oriented.  Psych: Normal affect. Skin: Normal. Musculoskeletal: No gross deformities.    ECG: Most recent ECG reviewed.      ASSESSMENT AND PLAN: 1. Chest pain and shortness of breath: Has had no further episodes of chest pain. CV testing reviewed above. Low risk stress test and normal cardiac function.  2. Essential HTN: Controlled. No changes.  Dispo: fu prn.   Kate Sable, M.D., F.A.C.C.

## 2016-06-12 NOTE — Patient Instructions (Signed)
Medication Instructions:  Continue all current medications.  Labwork: NONE  Testing/Procedures: NONE  Follow-Up: As needed.   Any Other Special Instructions Will Be Listed Below (If Applicable).  If you need a refill on your cardiac medications before your next appointment, please call your pharmacy.

## 2016-07-18 ENCOUNTER — Ambulatory Visit: Payer: BC Managed Care – PPO | Admitting: Neurology

## 2016-07-18 ENCOUNTER — Encounter: Payer: Self-pay | Admitting: Neurology

## 2016-07-18 VITALS — BP 142/88 | HR 84 | Resp 20 | Ht 62.0 in | Wt 221.0 lb

## 2016-07-18 DIAGNOSIS — Z9889 Other specified postprocedural states: Secondary | ICD-10-CM | POA: Diagnosis not present

## 2016-07-18 DIAGNOSIS — Z9989 Dependence on other enabling machines and devices: Principal | ICD-10-CM

## 2016-07-18 DIAGNOSIS — G4733 Obstructive sleep apnea (adult) (pediatric): Secondary | ICD-10-CM

## 2016-07-18 DIAGNOSIS — E669 Obesity, unspecified: Secondary | ICD-10-CM | POA: Diagnosis not present

## 2016-07-18 MED ORDER — ZOLPIDEM TARTRATE 10 MG PO TABS: 10.0000 mg | ORAL_TABLET | Freq: Every evening | ORAL | 1 refills | Status: DC | PRN

## 2016-07-18 NOTE — Progress Notes (Signed)
SLEEP MEDICINE CLINIC   Provider:  Larey Seat, M D  Referring Provider: Sharilyn Sites, MD Primary Care Physician:  Purvis Kilts, MD  Chief Complaint  Patient presents with  . Follow-up    has not started cpap after ENT surgery    HPI:  [name] is a 50 y.o. female , was seen here as a referral  from Dr. Hilma Favors for a  sleep consultation in 2016, now here for a Rv on CPAP.  [name] is seen here today on referral of Collene Mares, [place] and Dr. Hilma Favors M.D. at [facility]. She describes that she has at all full sleep pattern in her own words she is snoring short of breath and cannot sleep through the night. She actually doesn't nap in daytime either. There is a long-standing complained of insomnia snoring and sometimes restless legs. She feels that she doesn't get enough sleep and that her energy level is decreased. She also has increasing thirst and feels often very hot she tends to flush.  The shortness of breath also includes coughing but not wheezing.  She has gained weight over the last 5 years and has attributed some of this to cause her fatigue. She has also been diagnosed with irritable bowel syndrome ( not colitis),  high blood pressure and had in the past gestational diabetes but has not been diagnosed with diabetes mellitus since.  2 years ago she shied away from a sleep study that had already been scheduled , but her husband has been reminding her.  He learned during a conference from a  friend  what his wife does at night  fulfills the criteria of sleep apnea -and he is very concerned about her health.  She feels as if she has become a shallow breather in daytime and at nights. Recently she got a bronchitis and this made her sleep " terrible".  Sleep habits are as follows: She said that she lately lays down as early as 8:30 PM because she feels completely exhausted. She endorses no sleepiness but the fatigue score at 53 points.  By feeling  completely exhausted she does not fall asleep. She may initiate sleep as late as midnight but she has a bad habit of looking at the clock. The bedroom is cool and quiet and dark and she does not watch TV in it. She does not feel that she is worried or under exorbitant stress. The insomnia has been chronic and has increased the awake ear. She will sleep longest time probably for about 90 minutes before waking up again and she will have to use the bathroom then. It is not the bus from call that wakes her, there is no pain, no headache. It is difficult to fall asleep again. She has to rise in the morning at about 5 AM and by that time she may have only gotten 2 hours or 3 hours of sleep. She will fit bit between November 2015 and this November and each night seems to be a tossing and turning high activity enterprise. She does not feel that she ever gets restful sleep on that she is 3 charged restored and refreshed. When she wakes up she has a dry mouth she wakes up on her back. But she falls asleep on her left side. She cannot sleep prone. Since she does not nap in the afternoons she is chronically sleep deprived.  Her primary care physician has actually is placed her on a Ventolin inhaler as of  November this year because she had coughing and bronchitis. At the same time she was started on a prednisone Dosepak. It actually accentuated her insomnia , She was also placed on doxycycline and has completed the course. In July she started on Zoloft, in February on lisinopril, and April last year on Nexium and Colestid. Sleep medical history and family sleep history:  No history history of night terrors, sleep walking or sleep talking. No sleep eating. Her son sleepwalks. Social history: married, mother of a 42 year old.   [name] is looking forward to a septoplasty and sinus surgery in June of this year, but school is out. 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 The patient has a 97% compliance for days of use, CPAP has been used 27 out of 30 days for over 4 hours with an average user time of 6 hours and 7 minutes. This is an AutoSet between 5 and 15. 3 cm EPR. There are no major air leaks, the 91st percentile pressure is 13.5 and the residual AHI is 2.0. Mrs. Pezza will be excused from using CPAP after her nasal and sinus surgery for a period of 4 weeks until ENT clears her to resume. She can remain on the current AutoSet. I predict that she may need less pressure after the surgery.   01-09-2016 [name] Level is here today for her compliance visit. She is a meanwhile 50 year old lady and underwent a sleep study on 12-20 1-16. She was diagnosed with severe apnea at an AHI of 41.4, lowest oxygen saturation was 75% and a total desaturation time of 48.5 minutes was measured in the first 2 hours. She was titrated to CPAP at 5 beginning pressure to a final pressure of 11 cm water and her AHI was reduced to 0.8 per hour she does have significant and frequent periodic limb movements she actually "" kicked herself awake "about 8-9 times per hour. She is using CPAP, but it is a love hate relationship. She feels paranoid - she has rhinitis and often congested nasal passages which made the use of a smaller interface impossible. Therefore that she is now wearing a full face mask. Given her difficulties with CPAP I have to congratulate her on her compliance she has used the machine 97% of the last 30 days and 83% over 4 hours of continued use were recorded. The average user time is 6 hours 40 minutes the machine is set at 11 cm with 3 cm EPR the residual  AHI is 7.4. I will change her machine to an AutoSet. Our goal will be 5 to 15 cm water with 3 cm EPR. She takes Ambien with CPAP.  the patient describes waking up gasping for air and having insomnia-  these may be to conditions not correlated to each other. I  think that she wakes up frequently at night is probably a factor of insomnia with apnea that she has trouble initiating sleep however is probably not related.  I will continue to provide Ambien as a 3 -4 nights per week medication.  I do wonder if it could be still a menopausal sign.   Interval history from 07/18/2016. I have the pleasure of seeing Mrs. Wolfenden today, who has used her CPAP machine for the last 25 days and 22 of these days over 4 hours. Average user time is 6 hours and 33 minutes, the  minimum pressure is 5 and maximum pressure of 15 cm water on an auto setting. School has started back and she is sleeping a little better. Her 95th percentile pressure was 13 cm water her AHI is 1.9. Based on this the patient has a over 80% compliance and very good resolution of apnea. She endorsed the Epworth sleepiness score at 4 points.    1) OSA ,severe - AHI was  41.4/ hr. witnessed snoring, apnea and gasping. Improved on CPAP, but claustrophobia.  Obesity is main risk factor.    Review of Systems: Out of a complete 14 system review, the patient complains of only the following symptoms, and all other reviewed systems are negative. Snoring, gasping, feeling exhausted. Her insomnia is responding to benadryl and ambien.   Epworth score 4 Fatigue severity score 40 from 53 prior to CPAP.  She is continuously nasal congested and today as well. She also has ciliary injection and hayfever with itching of the eyes. Ambien is a new best friend, I hope that this can also be reduced after her nasal surgery .    Social History   Social History  . Marital status: Married    Spouse name: N/A  . Number of children: N/A  . 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 Smokeless tobacco: Never Used  . Alcohol use 0.0 oz/week     Comment: socially  . Drug use: No  . Sexual activity: Yes    Birth  control/ protection: Other-see comments     Comment: vasectomy-1st intercourse 72 yo-5 partners   Other Topics Concern  . Not on file   Social History Narrative   Drinks 1 cup of coffee and 12 oz ginger ale.    Family History  Problem Relation Age of Onset  . Hypertension Father   . Heart attack Father   . Hypertension Sister   . Breast cancer Sister 38  . Hypertension Mother   . Colon polyps Neg Hx   . Colon cancer Neg Hx   . Cervical cancer Sister     Past Medical History:  Diagnosis Date  . Anxiety   . Cervical atypia, mild 12/2013   Sonohysterogram biopsy showed benign endometrium but inflamed mild atypia and squamous mucosa fragments recommend repeat Pap smear 6 months  . GERD (gastroesophageal reflux disease)   . HTN (hypertension)   . Irritable bowel syndrome   . Obesity   . Sleep apnea    C PAP  . Snoring   . URI (upper respiratory infection)     Past Surgical History:  Procedure Laterality Date  . 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 CESAREAN SECTION  10/2003  . CHOLECYSTECTOMY  2006  . COLONOSCOPY  2009   Dr. Oneida Alar: frequent sigmoid colon diverticula, small internal hemrorhoids, colonic biopsies to assess for microscopic colitis, Benign   . GYNECOLOGIC CRYOSURGERY    . MAXILLARY ANTROSTOMY Left 05/14/2016   Procedure: LEFT ENDOSCOPIC MAXILLARY ANTROSTOMY WITH FUSION NAVIGATION;  Surgeon: Leta Baptist, MD;  Location: [facility];  Service: ENT;  Laterality: Left;  LEFT ENDOSCOPIC MAXILLARY ANTROSTOMY WITH FUSION NAVIGATION  . NASAL SEPTOPLASTY W/ TURBINOPLASTY Bilateral 05/14/2016   Procedure: NASAL SEPTOPLASTY WITH TURBINATE REDUCTION;  Surgeon: Leta Baptist, MD;  Location: [facility];  Service: ENT;  Laterality: Bilateral;  NASAL SEPTOPLASTY WITH TURBINATE REDUCTION  .  SINUS ENDO WITH FUSION Right 05/14/2016   Procedure: RIGHT ENDOSCOPIC  ETHMOIDECTOMY AND RIGHT ENDOSCOPIC FRONTAL SINUS EXPLORATION;  Surgeon: Leta Baptist, MD;   Location: [facility];  Service: ENT;  Laterality: Right;  RIGHT ENDOSCOPIC  ETHMOIDECTOMY AND RIGHT ENDOSCOPIC FRONTAL SINUS EXPLORATION    Current Outpatient Prescriptions  Medication Sig Dispense Refill  . colestipol (COLESTID) 1 g tablet TAKE 1 TABLET BY MOUTH 30 MINUTES BEFORE DINNER. MAY ALSO TAKE 1 TABLET BEFORE BEAKFAST OR LUNCH IF DIARRHEA IS NOT CONTROLLED 60 tablet 3  . esomeprazole (NEXIUM) 20 MG capsule Take 20 mg by mouth daily before breakfast.    . losartan (COZAAR) 100 MG tablet Take 1 tablet by mouth daily.    Marland Kitchen rOPINIRole (REQUIP) 0.25 MG tablet TAKE 1 TABLET BY MOUTH EVERY NIGHT AT BEDTIME 30 tablet 0  . sertraline (ZOLOFT) 50 MG tablet Take 50 mg by mouth every morning.    . zolpidem (AMBIEN) 10 MG tablet Take 10 mg by mouth at bedtime as needed for sleep.     No current facility-administered medications for this visit.     Allergies as of 07/18/2016 - Review Complete 07/18/2016  Allergen Reaction Noted  . Penicillins Shortness Of Breath     Vitals: BP (!) 142/88   Pulse 84   Resp 20   Ht 5\' 2"  (1.575 m)   Wt 221 lb (100.2 kg)   LMP 12/05/2013   BMI 40.42 kg/m  Last Weight:  Wt Readings from Last 1 Encounters:  07/18/16 221 lb (100.2 kg)   [id number] mass index is 40.42 kg/m.     Last Height:   Ht Readings from Last 1 Encounters:  07/18/16 5\' 2"  (1.575 m)    Physical exam:  General: The patient is awake, alert and appears not in acute distress. The patient is well groomed. Head: Normocephalic, atraumatic. Neck is supple. Mallampati 2  neck circumference:   16.5 . Nasal airflow is sevrely restricted , TMJ is not evident . Retrognathia is seen.  Cardiovascular:  Regular rate and rhythm, without  murmurs or carotid bruit, and without distended neck veins. Respiratory: Lungs are clear to auscultation. Skin:  Without evidence of edema, or rash Trunk: BMI is elevated  The patient's posture is erect.   Neurologic exam : The patient is awake  and alert, oriented to place and time.   Mood and affect are appropriate.  Cranial nerves: Pupils are equal and briskly reactive to light. Funduscopic exam without  evidence of pallor or edema.  Extraocular movements  in vertical and horizontal planes intact and without nystagmus. Visual fields by finger perimetry are intact.  Facial motor strength is symmetric and tongue and uvula move midline. Shoulder shrug was symmetrical.   Motor- no increased tone, no rigor.   The patient was advised of the nature of the diagnosed sleep disorder , the treatment options and risks for general a health and wellness arising from not treating the condition.  I spent more than 25 minutes of face to face time with the patient. Greater than 50% of time was spent in counseling and coordination of care. We have discussed the diagnosis and differential and I answered the patient's questions.     Assessment:  After physical and neurologic examination, review of laboratory studies,  Personal review of imaging studies, reports of other /same Imaging studies. Results of polysomnography/ neurophysiology testing and pre-existing records as far as provided in visit., my assessment is   1) OSA ,severe - AHI was  41.4/ hr. witnessed snoring, apnea and gasping. Improved on CPAP, but claustrophobia.  Obesity is main risk factor. Insomnia treated with prn Ambien.   Plan:  Treatment plan and additional workup :  Return for CPAP follow up about 6  Month.   Asencion Partridge Vihana Kydd MD  07/18/2016    CC: Sharilyn Sites, [place], [place] [address]

## 2016-09-03 ENCOUNTER — Ambulatory Visit (INDEPENDENT_AMBULATORY_CARE_PROVIDER_SITE_OTHER): Payer: BC Managed Care – PPO | Admitting: Otolaryngology

## 2016-09-03 DIAGNOSIS — J320 Chronic maxillary sinusitis: Secondary | ICD-10-CM | POA: Diagnosis not present

## 2016-09-03 DIAGNOSIS — J321 Chronic frontal sinusitis: Secondary | ICD-10-CM | POA: Diagnosis not present

## 2016-09-03 DIAGNOSIS — J310 Chronic rhinitis: Secondary | ICD-10-CM

## 2016-10-01 ENCOUNTER — Ambulatory Visit (INDEPENDENT_AMBULATORY_CARE_PROVIDER_SITE_OTHER): Payer: BC Managed Care – PPO | Admitting: Otolaryngology

## 2016-10-01 DIAGNOSIS — J321 Chronic frontal sinusitis: Secondary | ICD-10-CM

## 2016-10-01 DIAGNOSIS — J322 Chronic ethmoidal sinusitis: Secondary | ICD-10-CM | POA: Diagnosis not present

## 2016-10-01 DIAGNOSIS — J320 Chronic maxillary sinusitis: Secondary | ICD-10-CM

## 2016-10-01 DIAGNOSIS — J310 Chronic rhinitis: Secondary | ICD-10-CM | POA: Diagnosis not present

## 2017-02-02 ENCOUNTER — Other Ambulatory Visit: Payer: Self-pay | Admitting: Neurology

## 2017-02-12 ENCOUNTER — Telehealth: Payer: Self-pay | Admitting: Neurology

## 2017-02-12 NOTE — Telephone Encounter (Signed)
I called pt. I advised her that a sooner appt is available on 02/14/2017 at 2:30pm. Pt accepted this appt. Pt verbalized understanding of new appt date and time.

## 2017-02-12 NOTE — Telephone Encounter (Signed)
Pt called on yesterday evening(around 4:57) re: refill of medication, advised her an appointment would be needed for her to be seen for her request.  First available is 6-20 which she accepted.  I put her on call wait list but also wanted to reach out to see if there would be anything you could do for her to be seen before since she doesn't want to be without medication until June.

## 2017-02-14 ENCOUNTER — Ambulatory Visit (INDEPENDENT_AMBULATORY_CARE_PROVIDER_SITE_OTHER): Payer: BC Managed Care – PPO | Admitting: Neurology

## 2017-02-14 ENCOUNTER — Encounter: Payer: Self-pay | Admitting: Neurology

## 2017-02-14 VITALS — BP 154/87 | HR 90 | Resp 20 | Ht 61.0 in | Wt 225.0 lb

## 2017-02-14 DIAGNOSIS — G2581 Restless legs syndrome: Secondary | ICD-10-CM

## 2017-02-14 DIAGNOSIS — Z9989 Dependence on other enabling machines and devices: Secondary | ICD-10-CM | POA: Diagnosis not present

## 2017-02-14 DIAGNOSIS — F5102 Adjustment insomnia: Secondary | ICD-10-CM | POA: Diagnosis not present

## 2017-02-14 DIAGNOSIS — G4733 Obstructive sleep apnea (adult) (pediatric): Secondary | ICD-10-CM | POA: Diagnosis not present

## 2017-02-14 MED ORDER — ROPINIROLE HCL 0.25 MG PO TABS: 0.2500 mg | ORAL_TABLET | Freq: Every day | ORAL | 3 refills | Status: DC

## 2017-02-14 NOTE — Progress Notes (Signed)
SLEEP MEDICINE CLINIC   Provider:  Larey Seat, M D  Referring Provider: Sharilyn Sites, MD Primary Care Physician:  Purvis Kilts, MD  Chief Complaint  Patient presents with  . Follow-up    needs refill on requip, on cpap    HPI:  [name] is a 51 y.o. female , was seen here as a referral  from Dr. Hilma Favors for a  sleep consultation in 2016, now here for a Rv on CPAP.  [name] is seen here today on referral of Collene Mares, [place] and Dr. Hilma Favors M.D. at [facility]. She describes that she has an" awfull sleep pattern" in her own words she is snoring short of breath and cannot sleep through the night.  She actually doesn't nap in daytime either. There is a long-standing complained of insomnia snoring and sometimes restless legs. She feels that she doesn't get enough sleep and that her energy Donaldson is decreased. She also has increasing thirst and feels often very hot she tends to flush.  The shortness of breath also includes coughing but not wheezing.  She has gained weight over the last 5 years and has attributed some of this to cause her fatigue. She has also been diagnosed with irritable bowel syndrome ( not colitis),  high blood pressure and had in the past gestational diabetes but has not been diagnosed with diabetes mellitus since.  2 years ago she shied away from a sleep study that had already been scheduled , but her husband has been reminding her.  He learned during a conference from a  friend  what his wife does at night  fulfills the criteria of sleep apnea -and he is very concerned about her health.  She feels as if she has become a shallow breather in daytime and at nights. Recently she got a bronchitis and this made her sleep " terrible".  Sleep habits are as follows: She said that she lately lays down as early as 8:30 PM because she feels completely exhausted. She endorses no sleepiness but the fatigue score at 53 points.  By feeling  completely exhausted she does not fall asleep. She may initiate sleep as late as midnight but she has a bad habit of looking at the clock. The bedroom is cool and quiet and dark and she does not watch TV in it. She does not feel that she is worried or under exorbitant stress. The insomnia has been chronic and has increased the awake ear. She will sleep longest time probably for about 90 minutes before waking up again and she will have to use the bathroom then. It is not the bus from call that wakes her, there is no pain, no headache. It is difficult to fall asleep again. She has to rise in the morning at about 5 AM and by that time she may have only gotten 2 hours or 3 hours of sleep. She will fit bit between November 2015 and this November and each night seems to be a tossing and turning high activity enterprise. She does not feel that she ever gets restful sleep on that she is 3 charged restored and refreshed. When she wakes up she has a dry mouth she wakes up on her back. But she falls asleep on her left side. She cannot sleep prone. Since she does not nap in the afternoons she is chronically sleep deprived.  Her primary care physician has actually is placed her on a Ventolin inhaler as of November  this year because she had coughing and bronchitis. At the same time she was started on a prednisone Dosepak. It actually accentuated her insomnia , She was also placed on doxycycline and has completed the course. In July she started on Zoloft, in February on lisinopril, and April last year on Nexium and Colestid. Sleep medical history and family sleep history:  No history history of night terrors, sleep walking or sleep talking. No sleep eating. Her son sleepwalks. Social history: married, mother of a 18 year old.  [name] is looking forward to a septoplasty and sinus surgery in June of this year, but school is out. 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 The patient has a 97% compliance for days of use, CPAP has been used 27 out of 30 days for over 4 hours with an average user time of 6 hours and 7 minutes. This is an AutoSet between 5 and 15. 3 cm EPR. There are no major air leaks, the 91st percentile pressure is 13.5 and the residual AHI is 2.0. [name] will be excused from using CPAP after her nasal and sinus surgery for a period of 4 weeks until ENT clears her to resume. She can remain on the current AutoSet. I predict that she may need less pressure after the surgery.   01-09-2016 [name] is here today for her compliance visit. She is a meanwhile 51 year old lady and underwent a sleep study on 12-20 1-16. She was diagnosed with severe apnea at an AHI of 41.4, lowest oxygen saturation was 75% and a total desaturation time of 48.5 minutes was measured in the first 2 hours. She was titrated to CPAP at 5 beginning pressure to a final pressure of 11 cm water and her AHI was reduced to 0.8 per hour she does have significant and frequent periodic limb movements she actually "" kicked herself awake "about 8-9 times per hour. She is using CPAP, but it is a love hate relationship. She feels paranoid - she has rhinitis and often congested nasal passages which made the use of a smaller interface impossible. Therefore that she is now wearing a full face mask. Given her difficulties with CPAP I have to congratulate her on her compliance she has used the machine 97% of the last 30 days and 83% over 4 hours of continued use were recorded. The average user time is 6 hours 40 minutes the machine is set at 11 cm with 3 cm EPR the residual  AHI is 7.4. I will change her machine to an AutoSet. Our goal will be 5 to 15 cm water with 3 cm EPR. She takes Ambien with CPAP.  the patient describes waking up gasping for air and having insomnia-  these may be to conditions not correlated to each other. I  think that she wakes up frequently at night is probably a factor of insomnia with apnea that she has trouble initiating sleep however is probably not related.  I will continue to provide Ambien as a 3 -4 nights per week medication.  I do wonder if it could be still a menopausal sign.   Interval history from 07/18/2016. I have the pleasure of seeing [name] today, who has used her CPAP machine for the last 25 days and 22 of these days over 4 hours. Average user time is 6 hours and 33 minutes, the minimum pressure  is 5 and maximum pressure of 15 cm water on an auto setting. School has started back and she is sleeping a little better. Her 95th percentile pressure was 13 cm water her AHI is 1.9. Based on this the patient has a over 80% compliance and very good resolution of apnea. She endorsed the Epworth sleepiness score at 4 points.  History from 02/14/2017 [name] is still not on friendly terms with her CPAP, but she continues to use it compliantly. Compliance download shows 100% compliance was 8 hours 50 minutes average user time on an AutoSet between 5 and 15 cm water with 37 m EPR, residual AHI is 2.5, 95th percentile pressure is 14.4 so close to the upper limit. All residual apnea is obstructive in nature. I would like to increase the upper pressure limited to 17 cm water. She had nasal surgery, not longer Ambien.    Review of Systems: Out of a complete 14 system review, the patient complains of only the following symptoms, and all other reviewed systems are negative. Snoring, gasping, feeling exhausted. Her insomnia is responding to benadryl and ambien.  RLS - refill today  Epworth score o,  Fatigue severity score 43.  She is continuously nasal congested, still always feels tired  She also has ciliary injection and hayfever with itching of the  Eyes water.  Social History   Social History  . Marital status: Married    Spouse name: N/A  . Number of children: N/A  . Years of  education: N/A   Occupational History  . teacher assistant [employer] History Main Topics  . 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 Smokeless tobacco: Never Used  . Alcohol use 0.0 oz/week     Comment: socially  . Drug use: No  . Sexual activity: Yes    Birth control/ protection: Other-see comments     Comment: vasectomy-1st intercourse 51 yo-5 partners   Other Topics Concern  . Not on file   Social History Narrative   Drinks 1 cup of coffee and 12 oz ginger ale.    Family History  Problem Relation Age of Onset  . Hypertension Father   . Heart attack Father   . Hypertension Sister   . Breast cancer Sister 21  . Hypertension Mother   . Colon polyps Neg Hx   . Colon cancer Neg Hx   . Cervical cancer Sister     Past Medical History:  Diagnosis Date  . Anxiety   . Cervical atypia, mild 12/2013   Sonohysterogram biopsy showed benign endometrium but inflamed mild atypia and squamous mucosa fragments recommend repeat Pap smear 6 months  . GERD (gastroesophageal reflux disease)   . HTN (hypertension)   . Irritable bowel syndrome   . Obesity   . Sleep apnea    C PAP  . Snoring   . URI (upper respiratory infection)     Past Surgical History:  Procedure Laterality Date  . 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 CESAREAN SECTION  10/2003  . CHOLECYSTECTOMY  2006  . COLONOSCOPY  2009   Dr. Oneida Alar: frequent sigmoid colon diverticula, small internal hemrorhoids, colonic biopsies to assess for microscopic colitis, Benign   . GYNECOLOGIC CRYOSURGERY    . MAXILLARY ANTROSTOMY Left 05/14/2016   Procedure: LEFT ENDOSCOPIC MAXILLARY ANTROSTOMY WITH FUSION NAVIGATION;  Surgeon: Leta Baptist, MD;  Location: [place];  Service: ENT;  Laterality: Left;  LEFT ENDOSCOPIC MAXILLARY ANTROSTOMY WITH  FUSION NAVIGATION  . NASAL SEPTOPLASTY W/ TURBINOPLASTY Bilateral 05/14/2016   Procedure: NASAL SEPTOPLASTY WITH TURBINATE REDUCTION;  Surgeon: Leta Baptist, MD;   Location: [place];  Service: ENT;  Laterality: Bilateral;  NASAL SEPTOPLASTY WITH TURBINATE REDUCTION  . SINUS ENDO WITH FUSION Right 05/14/2016   Procedure: RIGHT ENDOSCOPIC  ETHMOIDECTOMY AND RIGHT ENDOSCOPIC FRONTAL SINUS EXPLORATION;  Surgeon: Leta Baptist, MD;  Location: [place];  Service: ENT;  Laterality: Right;  RIGHT ENDOSCOPIC  ETHMOIDECTOMY AND RIGHT ENDOSCOPIC FRONTAL SINUS EXPLORATION    Current Outpatient Prescriptions  Medication Sig Dispense Refill  . colestipol (COLESTID) 1 g tablet TAKE 1 TABLET BY MOUTH 30 MINUTES BEFORE DINNER. MAY ALSO TAKE 1 TABLET BEFORE BEAKFAST OR LUNCH IF DIARRHEA IS NOT CONTROLLED 60 tablet 3  . esomeprazole (NEXIUM) 20 MG capsule Take 20 mg by mouth daily before breakfast.    . losartan (COZAAR) 100 MG tablet Take 1 tablet by mouth daily.    Marland Kitchen rOPINIRole (REQUIP) 0.25 MG tablet TAKE 1 TABLET BY MOUTH EVERY NIGHT AT BEDTIME 30 tablet 0  . sertraline (ZOLOFT) 50 MG tablet Take 50 mg by mouth every morning.     No current facility-administered medications for this visit.     Allergies as of 02/14/2017 - Review Complete 02/14/2017  Allergen Reaction Noted  . Penicillins Shortness Of Breath     Vitals: BP (!) 154/87   Pulse 90   Resp 20   Ht 5\' 1"  (1.549 m)   Wt 225 lb (102.1 kg)   LMP 12/05/2013   BMI 42.51 kg/m  Last Weight:  Wt Readings from Last 1 Encounters:  02/14/17 225 lb (102.1 kg)   YTK:ZSWF mass index is 42.51 kg/m.     Last Height:   Ht Readings from Last 1 Encounters:  02/14/17 5\' 1"  (1.549 m)    Physical exam:  General: The patient is awake, alert and appears not in acute distress. The patient is well groomed. Head: Normocephalic, atraumatic. Neck is supple. Mallampati 2  neck circumference:   16.5 . Nasal airflow is sevrely restricted , TMJ is not evident . Retrognathia is seen.  Cardiovascular:  Neurologic exam : The patient is awake and alert, oriented to place and time.   Mood and  affect are appropriate. Cranial nerves: Pupils are equal and briskly reactive to light. Funduscopic exam without  evidence of pallor or edema.  Extraocular movements  in vertical and horizontal planes intact and without nystagmus. Visual fields by finger perimetry are intact.  Facial motor strength is symmetric and tongue and uvula move midline. Shoulder shrug was symmetrical.  No loss of sensory, tingling or numbness.  Motor- no increased tone, no rigor.  Normal gait. Unassisted.    The patient was advised of the nature of the diagnosed sleep disorder , the treatment options and risks for general a health and wellness arising from not treating the condition.  I spent more than 25 minutes of face to face time with the patient. Greater than 50% of time was spent in counseling and coordination of care. We have discussed the diagnosis and differential and I answered the patient's questions.     Assessment:  After physical and neurologic examination, review of laboratory studies,  Personal review of imaging studies, reports of other /same Imaging studies. Results of polysomnography/ neurophysiology testing and pre-existing records as far as provided in visit., my assessment is   1) OSA ,severe - AHI was  41.4/ hr. witnessed snoring, apnea and  gasping. Improved on CPAP, but claustrophobia.  Obesity is main risk factor. Insomnia treated with prn Ambien. CPAP dependent. Upper pressure limit from 15-17 cm water today.  2)RLS  I also will refill her restless leg medication. I have explained to the patient that the restless leg medication can cause daytime sleepiness is  3) insomnia- she weaned off ambien.   Plan:  Treatment plan and additional workup :  Rv in 12 mont, with Np , regular RV for CPAP compliance.    Asencion Partridge Kourtnei Rauber MD  02/14/2017    CC: Sharilyn Sites, [address], [place] [id number]

## 2017-03-04 ENCOUNTER — Ambulatory Visit (INDEPENDENT_AMBULATORY_CARE_PROVIDER_SITE_OTHER): Payer: BC Managed Care – PPO | Admitting: Otolaryngology

## 2017-03-04 DIAGNOSIS — J310 Chronic rhinitis: Secondary | ICD-10-CM

## 2017-05-14 ENCOUNTER — Other Ambulatory Visit: Payer: Self-pay | Admitting: Gynecology

## 2017-05-14 DIAGNOSIS — Z1231 Encounter for screening mammogram for malignant neoplasm of breast: Secondary | ICD-10-CM

## 2017-05-15 ENCOUNTER — Ambulatory Visit: Payer: BC Managed Care – PPO | Admitting: Neurology

## 2017-05-28 ENCOUNTER — Telehealth: Payer: Self-pay

## 2017-05-28 ENCOUNTER — Other Ambulatory Visit: Payer: Self-pay | Admitting: Gastroenterology

## 2017-05-28 NOTE — Telephone Encounter (Signed)
[phone] patient received letter to schedule tcs

## 2017-05-28 NOTE — Telephone Encounter (Signed)
LMOM to call.

## 2017-05-28 NOTE — Telephone Encounter (Signed)
Refill X 1 since upcoming TCS planned. We have not seen her since late 2015. She will need to be seen prior to additional refills per policy.

## 2017-05-28 NOTE — Telephone Encounter (Signed)
Letter mailed to pt to call for appt before more refills.  

## 2017-05-31 ENCOUNTER — Ambulatory Visit (INDEPENDENT_AMBULATORY_CARE_PROVIDER_SITE_OTHER): Payer: BC Managed Care – PPO | Admitting: Gynecology

## 2017-05-31 ENCOUNTER — Encounter: Payer: BC Managed Care – PPO | Admitting: Gynecology

## 2017-05-31 ENCOUNTER — Encounter: Payer: Self-pay | Admitting: Gynecology

## 2017-05-31 ENCOUNTER — Ambulatory Visit
Admission: RE | Admit: 2017-05-31 | Discharge: 2017-05-31 | Disposition: A | Discharge status: Home / Self Care | Payer: BC Managed Care – PPO | Source: Ambulatory Visit | Attending: Gynecology | Admitting: Gynecology

## 2017-05-31 VITALS — BP 132/80 | Ht 62.0 in | Wt 229.0 lb

## 2017-05-31 DIAGNOSIS — Z1231 Encounter for screening mammogram for malignant neoplasm of breast: Secondary | ICD-10-CM

## 2017-05-31 DIAGNOSIS — Z01419 Encounter for gynecological examination (general) (routine) without abnormal findings: Secondary | ICD-10-CM

## 2017-05-31 NOTE — Addendum Note (Signed)
Addended by: Burnett Kanaris on: 05/31/2017 03:12 PM   Modules accepted: Orders

## 2017-05-31 NOTE — Patient Instructions (Signed)
Follow up in one year, sooner as needed. 

## 2017-05-31 NOTE — Progress Notes (Signed)
    [name] [birth year] [id number]        51 y.o.  G1P1 for annual exam.    Past medical history,surgical history, problem list, medications, allergies, family history and social history were all reviewed and documented as reviewed in the EPIC chart.  ROS:  Performed with pertinent positives and negatives included in the history, assessment and plan.   Additional significant findings :  None   Exam: Wandra Scot assistant Vitals:   05/31/17 1445  BP: 132/80  Weight: 229 lb (103.9 kg)  Height: 5\' 2"  (1.575 m)   Body mass index is 41.88 kg/m.  General appearance:  Normal affect, orientation and appearance. Skin: Grossly normal HEENT: Without gross lesions.  No cervical or supraclavicular adenopathy. Thyroid normal.  Lungs:  Clear without wheezing, rales or rhonchi Cardiac: RR, without RMG Abdominal:  Soft, nontender, without masses, guarding, rebound, organomegaly or hernia Breasts:  Examined lying and sitting without masses, retractions, discharge or axillary adenopathy. Pelvic:  Ext, BUS, Vagina: Normal with mild atrophic changes  Cervix: Normal  Uterus: Anteverted, normal size, shape and contour, midline and mobile nontender   Adnexa: Without masses or tenderness    Anus and perineum: Normal   Rectovaginal: Normal sphincter tone without palpated masses or tenderness.    Assessment/Plan:  51 y.o. G1P1 female for annual exam.   1. Postmenopausal/mild atrophic changes. No significant hot flushes, night sweats, vaginal dryness or any bleeding. Continue monitor report any issues or bleeding. 2. Pap smear/HPV 2017 negative. Pap smear/HPV 11/2013 negative. Sonohysterogram with biopsy 2015 due to irregular bleeding showed atrophic endometrium with slight squamous atypia and squamous epithelial fragments. P 60 staining was negative. 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 Pap smear done today. If negative then plan less frequent screening intervals. 3. Mammography today.  Continue with annual mammography next year. Breast exam normal today. 4. Colonoscopy being arranged now. 5. DEXA never. Will plan further into the menopause. 6. Health maintenance. No routine blood work drawn as patient has appointment to see her primary physician and plans to do there. Follow up in one year, sooner as needed.   Anastasio Auerbach MD, 3:08 PM 05/31/2017

## 2017-06-03 ENCOUNTER — Telehealth: Payer: Self-pay

## 2017-06-03 NOTE — Telephone Encounter (Signed)
See separate triage.  

## 2017-06-04 LAB — PAP IG W/ RFLX HPV ASCU

## 2017-06-05 ENCOUNTER — Other Ambulatory Visit: Payer: Self-pay

## 2017-06-05 DIAGNOSIS — Z1211 Encounter for screening for malignant neoplasm of colon: Secondary | ICD-10-CM

## 2017-06-05 NOTE — Telephone Encounter (Signed)
SENT Gastroenterology Pre-Procedure Review  Request Date: 06/03/2017 Requesting Physician: Dr. Hilma Favors  PATIENT REVIEW QUESTIONS: The patient responded to the following health history questions as indicated:    1. Diabetes Melitis: no 2. Joint replacements in the past 12 months: no 3. Major health problems in the past 3 months: no 4. Has an artificial valve or MVP: no 5. Has a defibrillator: no 6. Has been advised in past to take antibiotics in advance of a procedure like teeth cleaning: no 7. Family history of colon cancer: no  8. Alcohol Use: occasionally/ socially 9. History of sleep apnea: YES    HAS C PAP 10. History of coronary artery or other vascular stents placed within the last 12 months: no    MEDICATIONS & ALLERGIES:    Patient reports the following regarding taking any blood thinners:   Plavix? no Aspirin? no Coumadin? no Brilinta? no Xarelto? no Eliquis? no Pradaxa? no Savaysa? no Effient? no  Patient confirms/reports the following medications:  Current Outpatient Prescriptions  Medication Sig Dispense Refill  . colestipol (COLESTID) 1 g tablet TAKE 1 TABLET BY MOUTH 30 MINUTES BEFORE DINNER. MAY ALSO TAKE 1 TABLET BEFORE BEAKFAST OR LUNCH IF DIARRHEA IS NOT CONTROLLED (Patient taking differently: Only takes one tablet before breakfast) 60 tablet 0  . esomeprazole (NEXIUM) 20 MG capsule Take 20 mg by mouth daily before breakfast.    . losartan (COZAAR) 100 MG tablet Take 1 tablet by mouth daily.    Marland Kitchen rOPINIRole (REQUIP) 0.25 MG tablet Take 1 tablet (0.25 mg total) by mouth at bedtime. (Patient not taking: Reported on 06/03/2017) 90 tablet 3  . sertraline (ZOLOFT) 50 MG tablet Take 50 mg by mouth every morning.     No current facility-administered medications for this visit.     Patient confirms/reports the following allergies:    No orders of the defined types were placed in this encounter.   AUTHORIZATION INFORMATION     SCHEDULE INFORMATION: Procedure  has been scheduled as follows:  Date:  07/19/2017           Time: 8:30 AM Location: [facility] Short Stay  This Gastroenterology Pre-Precedure Review Form is being routed to the following provider(s): Barney Drain, MD

## 2017-06-12 NOTE — Telephone Encounter (Signed)
SUPREP SPLIT DOSING-REGULAR breakfast then CLEAR LIQUIDS after 9 am.   

## 2017-06-13 MED ORDER — NA SULFATE-K SULFATE-MG SULF 17.5-3.13-1.6 GM/177ML PO SOLN
1.0000 | ORAL | 0 refills | Status: DC
Start: 2017-06-13 — End: 2017-07-24

## 2017-06-13 NOTE — Telephone Encounter (Signed)
Rx sent to the pharmacy and instructions mailed to pt.  

## 2017-06-17 NOTE — Telephone Encounter (Signed)
PT called and cancelled procedure for 07/19/2017 and Hoyle Sauer is aware. Her son has a tournament. She will call back end of August for September appt.

## 2017-06-17 NOTE — Telephone Encounter (Signed)
NO PA is needed for TCS 

## 2017-07-10 ENCOUNTER — Other Ambulatory Visit: Payer: Self-pay

## 2017-07-10 DIAGNOSIS — Z1211 Encounter for screening for malignant neoplasm of colon: Secondary | ICD-10-CM

## 2017-07-10 NOTE — Telephone Encounter (Signed)
Pt is scheduled for 08/23/2017 at 1:00 pm with Dr. Oneida Alar. She has Suprep. New instructions mailed to pt.

## 2017-07-10 NOTE — Telephone Encounter (Signed)
LMOM that I have the Sept schedule when she wants to schedule.

## 2017-07-16 ENCOUNTER — Encounter (HOSPITAL_COMMUNITY): Payer: Self-pay

## 2017-07-16 ENCOUNTER — Ambulatory Visit (HOSPITAL_COMMUNITY): Payer: BC Managed Care – PPO | Attending: Orthopedic Surgery

## 2017-07-16 DIAGNOSIS — M25512 Pain in left shoulder: Secondary | ICD-10-CM | POA: Insufficient documentation

## 2017-07-16 DIAGNOSIS — R29898 Other symptoms and signs involving the musculoskeletal system: Secondary | ICD-10-CM | POA: Insufficient documentation

## 2017-07-16 DIAGNOSIS — M25612 Stiffness of left shoulder, not elsewhere classified: Secondary | ICD-10-CM | POA: Insufficient documentation

## 2017-07-16 NOTE — Patient Instructions (Signed)
Complete the following exercises 2-3 times a day.  Doorway Stretch  Place each hand opposite each other on the doorway. (You can change where you feel the stretch by moving arms higher or lower.) Step through with one foot and bend front knee until a stretch is felt and hold. Step through with the opposite foot on the next rep. Hold for __10-15___ seconds. Repeat __2__times.     Scapular Retraction (Standing)   With arms at sides, pinch shoulder blades together. Repeat __10__ times per set. Do __1__ sets per session. Do __2__ sessions per day.  http://orth.exer.us/944   Copyright  VHI. All rights reserved.   Internal Rotation Across Back  Grab the end of a towel with your affected side, palm facing backwards. Grab the towel with your unaffected side and pull your affected hand across your back until you feel a stretch in the front of your shoulder. If you feel pain, pull just to the pain, do not pull through the pain. Hold. Return your affected arm to your side. Try to keep your hand/arm close to your body during the entire movement.     Hold for 10-15 seconds. Complete 2 times.          Wall Flexion  Slide your arm up the wall or door frame until a stretch is felt in your shoulder . Hold for 10-15 seconds. Complete 2 times     Shoulder Abduction Stretch  Stand side ways by a wall with affected up on wall. Gently step in toward wall to feel stretch. Hold for 10-15 seconds. Complete 2 times.    

## 2017-07-16 NOTE — Therapy (Signed)
[facility] [place], [place], [address] Phone: [phone]   Fax:  [phone]  Occupational Therapy Evaluation  Patient Details  Name: [name] MRN: [id number] Date of Birth: 1966-11-08 Referring Provider: Dr. Almedia Balls  Encounter Date: 07/16/2017      OT End of Session - 07/16/17 2052    Visit Number 1   Number of Visits 8   Date for OT Re-Evaluation 08/15/17   Authorization Type [place] $25 co-pay   OT Start Time 1735   OT Stop Time 1815   OT Time Calculation (min) 40 min   Activity Tolerance Patient tolerated treatment well   Behavior During Therapy [facility] for tasks assessed/performed      Past Medical History:  Diagnosis Date  . Anxiety   . Cervical atypia, mild 12/2013   Sonohysterogram biopsy showed benign endometrium but inflamed mild atypia and squamous mucosa fragments recommend repeat Pap smear 6 months  . GERD (gastroesophageal reflux disease)   . HTN (hypertension)   . Irritable bowel syndrome   . Obesity   . Sleep apnea    C PAP  . Snoring   . URI (upper respiratory infection)     Past Surgical History:  Procedure Laterality Date  . 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 CESAREAN SECTION  10/2003  . CHOLECYSTECTOMY  2006  . COLONOSCOPY  2009   Dr. Oneida Alar: frequent sigmoid colon diverticula, small internal hemrorhoids, colonic biopsies to assess for microscopic colitis, Benign   . GYNECOLOGIC CRYOSURGERY    . MAXILLARY ANTROSTOMY Left 05/14/2016   Procedure: LEFT ENDOSCOPIC MAXILLARY ANTROSTOMY WITH FUSION NAVIGATION;  Surgeon: Leta Baptist, MD;  Location: [place];  Service: ENT;  Laterality: Left;  LEFT ENDOSCOPIC MAXILLARY ANTROSTOMY WITH FUSION NAVIGATION  . NASAL SEPTOPLASTY W/ TURBINOPLASTY Bilateral 05/14/2016   Procedure: NASAL SEPTOPLASTY WITH TURBINATE REDUCTION;  Surgeon: Leta Baptist, MD;  Location: [place];  Service: ENT;   Laterality: Bilateral;  NASAL SEPTOPLASTY WITH TURBINATE REDUCTION  . SINUS ENDO WITH FUSION Right 05/14/2016   Procedure: RIGHT ENDOSCOPIC  ETHMOIDECTOMY AND RIGHT ENDOSCOPIC FRONTAL SINUS EXPLORATION;  Surgeon: Leta Baptist, MD;  Location: [place];  Service: ENT;  Laterality: Right;  RIGHT ENDOSCOPIC  ETHMOIDECTOMY AND RIGHT ENDOSCOPIC FRONTAL SINUS EXPLORATION    There were no vitals filed for this visit.      Subjective Assessment - 07/16/17 2045    Subjective  S: Before I got the shot in my shoulder I was in tears from the pain. It was a 10/10.   Pertinent History Patient is a 51 y/o female S/P left frozen shoulder which began in June 2018. Pt report she was under to put on deodorant and hook/unhook her bra. She received a cortisone shot last Turesday which has greatly helped decrease her pain and she has been trying to move and use her arm the best she can. Dr. Lynann Bologna has referred patient to occupational therapy for evaluation and treatment.   Special Tests FOTO score: 45/100   Patient Stated Goals To be able to use her left arm as normal as possible.   Currently in Pain? No/denies  Prior to her shot last Tuesday, pain was 10/10           [facility] OT Assessment - 07/16/17 1741      Assessment   Diagnosis left frozen shoulder   Referring Provider Dr. Almedia Balls  Onset Date --  June 2018   Assessment --  follow up appointment in 4 weeks   Prior Therapy None     Precautions   Precautions None     Restrictions   Weight Bearing Restrictions No     Balance Screen   Has the patient fallen in the past 6 months No     Home  Environment   Family/patient expects to be discharged to: Private residence     Prior Function   Level of Independence Independent   Vocation Full time employment   Patent examiner at [employer]     ADL   ADL comments Difficulty reaching all the way up overhead, limited with ROM when trying to hook her bra.      Mobility   Mobility Status Independent     Written Expression   Dominant Hand Right     Vision - History   Baseline Vision No visual deficits     Cognition   Overall Cognitive Status Within Functional Limits for tasks assessed     ROM / Strength   AROM / PROM / Strength AROM;PROM;Strength     Palpation   Palpation comment Max fascial restrictions in left upper arm, trapezius, and scapularis region.     AROM   Overall AROM Comments Assessed seated. IR/er adducted.   AROM Assessment Site Shoulder   Right/Left Shoulder Left   Left Shoulder Flexion 130 Degrees   Left Shoulder ABduction 86 Degrees   Left Shoulder Internal Rotation 90 Degrees   Left Shoulder External Rotation 60 Degrees     PROM   Overall PROM Comments Assessed supine. IR/er adducted.    PROM Assessment Site Shoulder   Right/Left Shoulder Left   Left Shoulder Flexion 145 Degrees   Left Shoulder ABduction 142 Degrees   Left Shoulder Internal Rotation 90 Degrees   Left Shoulder External Rotation 70 Degrees     Strength   Overall Strength Comments Assessed seated. IR/er adducted.    Strength Assessment Site Shoulder   Right/Left Shoulder Left   Left Shoulder Flexion 3/5   Left Shoulder ABduction 3-/5   Left Shoulder Internal Rotation 3/5   Left Shoulder External Rotation 3-/5                         OT Education - 07/16/17 2050    Education provided Yes   Education Details shoulder stretches   Person(s) Educated Patient   Methods Explanation;Demonstration;Verbal cues;Handout   Comprehension Verbalized understanding;Returned demonstration;Tactile cues required          OT Short Term Goals - 07/16/17 2105      OT SHORT TERM GOAL #1   Title Patient will be educated and independent with HEP to increase functional use of LUE during daily tasks.   Time 4   Period Weeks   Status New   Target Date 08/15/17     OT SHORT TERM GOAL #2   Title Patient will increase A/ROM to WNL to  increase ability to manage bra and reach overhead with less difficulty.   Time 4   Period Weeks   Status New     OT SHORT TERM GOAL #3   Title Patient will increase LUE strength to 4/5 to increase ability to return to normal daily household tasks while using LUE.   Time 4   Period Weeks   Status New     OT SHORT TERM GOAL #4   Title Pt  will decrease pain level to 3/10 or less when using LUE during daily tasks.    Time 4   Period Weeks   Status New     OT SHORT TERM GOAL #5   Title Pt will decrease fascial restrictions in LUE to min amount or less to increase functional mobility needed to complete overhead reaching.   Time 4   Period Weeks   Status New                  Plan - 07/16/17 2055    Clinical Impression Statement A: Patient is a 51 y/o female S/P left frozen shoulder causing increased fascial restrictions, pain and decreased ROM and strength resulting in difficulty completing daily tasks using her LUE.   Occupational performance deficits (Please refer to evaluation for details): ADL's;Leisure;IADL's   Rehab Potential Excellent   OT Frequency 2x / week   OT Duration 4 weeks   OT Treatment/Interventions Self-care/ADL training;Cryotherapy;Electrical Stimulation;Therapeutic exercise;Moist Heat;Patient/family education;Therapeutic activities;Passive range of motion;Manual Therapy;DME and/or AE instruction;Ultrasound   Plan P: Pt will benefit from skilled OT services to increase functional performance during ADL tasks while using LUE as non-dominant extremity. Treatment Plan: myofascial release, manual stretching, AA/ROM, A/ROM, general shoulder and scapular strengthening. Next session: Educate patient on completing self myofascial release with tennis ball.   Clinical Decision Making Limited treatment options, no task modification necessary   OT Home Exercise Plan 8/21: shoulder stretches   Consulted and Agree with Plan of Care Patient      Patient will benefit from  skilled therapeutic intervention in order to improve the following deficits and impairments:  Pain, Increased fascial restricitons, Decreased range of motion, Decreased strength, Impaired UE functional use  Visit Diagnosis: Other symptoms and signs involving the musculoskeletal system - Plan: Ot plan of care cert/re-cert  Acute pain of left shoulder - Plan: Ot plan of care cert/re-cert  Stiffness of left shoulder, not elsewhere classified - Plan: Ot plan of care cert/re-cert    Problem List Patient Active Problem List   Diagnosis Date Noted  . Insomnia due to psychological stress 02/14/2017  . RLS (restless legs syndrome) 02/14/2017  . Rhinitis, allergic 04/11/2016  . Ethmoid sinusitis 04/11/2016  . OSA on CPAP 04/11/2016  . Snoring 10/27/2015  . Hypersomnia with sleep apnea 10/27/2015  . Insomnia w/ sleep apnea 10/27/2015  . Menopausal symptoms 10/27/2015  . OBESITY 02/09/2009  . GERD 02/09/2009  . Diarrhea 02/09/2009  . CHOLECYSTECTOMY, HX OF 02/09/2009   Ailene Ravel, OTR/L,CBIS  [phone]  07/16/2017, 9:13 PM  [facility] [address] [place], [place], [address] Phone: [phone]   Fax:  [phone]  Name: [name] MRN: [id number] Date of Birth: [birth year]

## 2017-07-17 ENCOUNTER — Encounter (HOSPITAL_COMMUNITY): Payer: Self-pay | Admitting: Occupational Therapy

## 2017-07-17 ENCOUNTER — Ambulatory Visit (HOSPITAL_COMMUNITY): Payer: BC Managed Care – PPO | Admitting: Occupational Therapy

## 2017-07-17 DIAGNOSIS — R29898 Other symptoms and signs involving the musculoskeletal system: Secondary | ICD-10-CM | POA: Diagnosis not present

## 2017-07-17 DIAGNOSIS — M25512 Pain in left shoulder: Secondary | ICD-10-CM

## 2017-07-17 DIAGNOSIS — M25612 Stiffness of left shoulder, not elsewhere classified: Secondary | ICD-10-CM

## 2017-07-17 NOTE — Therapy (Signed)
[place], [place], [address] Phone: [phone]   Fax:  [phone]  Occupational Therapy Treatment  Patient Details  Name: [name] MRN: [id number] Date of Birth: November 15, 1966 Referring Provider: Dr. Almedia Balls  Encounter Date: 07/17/2017      OT End of Session - 07/17/17 1111    Visit Number 2   Number of Visits 8   Date for OT Re-Evaluation 08/15/17   Authorization Type [place] $25 co-pay   OT Start Time [phone]   OT Stop Time 1029   OT Time Calculation (min) 40 min   Activity Tolerance Patient tolerated treatment well   Behavior During Therapy [facility] for tasks assessed/performed      Past Medical History:  Diagnosis Date  . Anxiety   . Cervical atypia, mild 12/2013   Sonohysterogram biopsy showed benign endometrium but inflamed mild atypia and squamous mucosa fragments recommend repeat Pap smear 6 months  . GERD (gastroesophageal reflux disease)   . HTN (hypertension)   . Irritable bowel syndrome   . Obesity   . Sleep apnea    C PAP  . Snoring   . URI (upper respiratory infection)     Past Surgical History:  Procedure Laterality Date  . 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 CESAREAN SECTION  10/2003  . CHOLECYSTECTOMY  2006  . COLONOSCOPY  2009   Dr. Oneida Alar: frequent sigmoid colon diverticula, small internal hemrorhoids, colonic biopsies to assess for microscopic colitis, Benign   . GYNECOLOGIC CRYOSURGERY    . MAXILLARY ANTROSTOMY Left 05/14/2016   Procedure: LEFT ENDOSCOPIC MAXILLARY ANTROSTOMY WITH FUSION NAVIGATION;  Surgeon: Leta Baptist, MD;  Location: [place];  Service: ENT;  Laterality: Left;  LEFT ENDOSCOPIC MAXILLARY ANTROSTOMY WITH FUSION NAVIGATION  . NASAL SEPTOPLASTY W/ TURBINOPLASTY Bilateral 05/14/2016   Procedure: NASAL SEPTOPLASTY WITH TURBINATE REDUCTION;  Surgeon: Leta Baptist, MD;  Location: [place];  Service: ENT;   Laterality: Bilateral;  NASAL SEPTOPLASTY WITH TURBINATE REDUCTION  . SINUS ENDO WITH FUSION Right 05/14/2016   Procedure: RIGHT ENDOSCOPIC  ETHMOIDECTOMY AND RIGHT ENDOSCOPIC FRONTAL SINUS EXPLORATION;  Surgeon: Leta Baptist, MD;  Location: [place];  Service: ENT;  Laterality: Right;  RIGHT ENDOSCOPIC  ETHMOIDECTOMY AND RIGHT ENDOSCOPIC FRONTAL SINUS EXPLORATION    There were no vitals filed for this visit.      Subjective Assessment - 07/17/17 0946    Subjective  S: I'm a little sore this morning.    Currently in Pain? Yes   Pain Score 5    Pain Location Shoulder   Pain Orientation Left   Pain Descriptors / Indicators Sore   Pain Type Acute pain   Pain Radiating Towards none   Pain Onset More than a month ago   Pain Frequency Constant   Aggravating Factors  movement   Pain Relieving Factors pain medications   Effect of Pain on Daily Activities moderate effect on ADL completion   Multiple Pain Sites No            [facility] OT Assessment - 07/17/17 0946      Assessment   Diagnosis left frozen shoulder     Precautions   Precautions None                  OT Treatments/Exercises (OP) - 07/17/17 0950      Exercises   Exercises Shoulder  Shoulder Exercises: Supine   Protraction PROM;5 reps;AAROM;10 reps   Horizontal ABduction PROM;5 reps;AAROM;10 reps   External Rotation PROM;5 reps;AAROM;10 reps   Internal Rotation PROM;5 reps;AAROM;10 reps   Flexion PROM;5 reps;AAROM;10 reps   ABduction PROM;5 reps;AAROM;10 reps     Shoulder Exercises: Seated   Elevation AROM;10 reps   Extension AROM;10 reps   Row AROM;10 reps     Shoulder Exercises: Stretch   Corner Stretch 3 reps;10 seconds   Internal Rotation Stretch 3 reps  10 seconds   Wall Stretch - Flexion 3 reps;10 seconds     Manual Therapy   Manual Therapy Myofascial release   Manual therapy comments completed separately from therapeutic exercise   Myofascial Release Myofascial release to  left upper arm, trapezius, and scapularis regions to decrease pain and fascial restrictions and increase joint range of motion                OT Education - 07/16/17 2050    Education provided Yes   Education Details shoulder stretches   Person(s) Educated Patient   Methods Explanation;Demonstration;Verbal cues;Handout   Comprehension Verbalized understanding;Returned demonstration;Tactile cues required          OT Short Term Goals - 07/17/17 1011      OT SHORT TERM GOAL #1   Title Patient will be educated and independent with HEP to increase functional use of LUE during daily tasks.   Time 4   Period Weeks   Status On-going     OT SHORT TERM GOAL #2   Title Patient will increase A/ROM to WNL to increase ability to manage bra and reach overhead with less difficulty.   Time 4   Period Weeks   Status On-going     OT SHORT TERM GOAL #3   Title Patient will increase LUE strength to 4/5 to increase ability to return to normal daily household tasks while using LUE.   Time 4   Period Weeks   Status On-going     OT SHORT TERM GOAL #4   Title Pt will decrease pain level to 3/10 or less when using LUE during daily tasks.    Time 4   Period Weeks   Status On-going     OT SHORT TERM GOAL #5   Title Pt will decrease fascial restrictions in LUE to min amount or less to increase functional mobility needed to complete overhead reaching.   Time 4   Period Weeks   Status On-going                  Plan - 07/17/17 1112    Clinical Impression Statement A: Initiated myofascial release, manual therapy, P/ROM, AA/ROM, and scapular A/ROM this session, also completed shoulder stretches. Pt able to tolerate P/ROM [facility] this session, flexion continues to be the most limited. Verbal cuing for form and technique. Pt educated on self-myofascial release with tennis ball.    Plan P: Follow up on shoulder stretches, continue with AA/ROM adding in standing. Add wall wash. Update HEP  for AA/ROM.    OT Home Exercise Plan 8/21: shoulder stretches   Consulted and Agree with Plan of Care Patient      Patient will benefit from skilled therapeutic intervention in order to improve the following deficits and impairments:  Pain, Increased fascial restricitons, Decreased range of motion, Decreased strength, Impaired UE functional use  Visit Diagnosis: Other symptoms and signs involving the musculoskeletal system  Acute pain of left shoulder  Stiffness of left shoulder,  not elsewhere classified    Problem List Patient Active Problem List   Diagnosis Date Noted  . Insomnia due to psychological stress 02/14/2017  . RLS (restless legs syndrome) 02/14/2017  . Rhinitis, allergic 04/11/2016  . Ethmoid sinusitis 04/11/2016  . OSA on CPAP 04/11/2016  . Snoring 10/27/2015  . Hypersomnia with sleep apnea 10/27/2015  . Insomnia w/ sleep apnea 10/27/2015  . Menopausal symptoms 10/27/2015  . OBESITY 02/09/2009  . GERD 02/09/2009  . Diarrhea 02/09/2009  . CHOLECYSTECTOMY, HX OF 02/09/2009   Guadelupe Sabin, OTR/L  [phone] 07/17/2017, 11:14 AM  [facility] [place], [place], [address] Phone: [phone]   Fax:  [phone]  Name: [name] MRN: [id number] Date of Birth: Apr 18, 1966

## 2017-07-19 ENCOUNTER — Ambulatory Visit (HOSPITAL_COMMUNITY)
Admission: RE | Admit: 2017-07-19 | Payer: BC Managed Care – PPO | Source: Ambulatory Visit | Admitting: Gastroenterology

## 2017-07-19 ENCOUNTER — Encounter (HOSPITAL_COMMUNITY): Admission: RE | Payer: Self-pay | Source: Ambulatory Visit

## 2017-07-19 SURGERY — COLONOSCOPY
Anesthesia: Moderate Sedation

## 2017-07-24 ENCOUNTER — Ambulatory Visit (INDEPENDENT_AMBULATORY_CARE_PROVIDER_SITE_OTHER): Payer: BC Managed Care – PPO | Admitting: Gastroenterology

## 2017-07-24 ENCOUNTER — Encounter: Payer: Self-pay | Admitting: Gastroenterology

## 2017-07-24 VITALS — BP 129/76 | HR 81 | Temp 98.8°F | Ht 62.0 in | Wt 222.5 lb

## 2017-07-24 DIAGNOSIS — R197 Diarrhea, unspecified: Secondary | ICD-10-CM

## 2017-07-24 MED ORDER — COLESTIPOL HCL 1 G PO TABS: 1.0000 g | ORAL_TABLET | Freq: Every day | ORAL | 3 refills | Status: DC

## 2017-07-24 NOTE — Telephone Encounter (Addendum)
Per Roseanne Kaufman, NP, I have cancelled the pt's colonoscopy that was scheduled for 08/23/2017 at 1:00 pm. She will be triaged in Jan 2019.  Hoyle Sauer in Endo is aware.

## 2017-07-24 NOTE — Patient Instructions (Signed)
I have refilled the Colestid for you.  We will call you in January 2019 to arrange the colonoscopy.  We will see you yearly.  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

## 2017-07-24 NOTE — Progress Notes (Signed)
Referring Provider: Sharilyn Sites, MD Primary Care Physician:  Sharilyn Sites, MD Primary GI: Dr. Oneida Alar   Chief Complaint  Patient presents with  . Diarrhea    needs Colestid refill    HPI:   [name] is a 51 y.o. 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 Last seen Nov 2015. Here for refill on Colestid.   Takes Colestid in the morning, has a BM twice, then good for the day. If doesn't take it, never knows when she is going to go. No abdominal pain. No N/V. No dysphagia. No rectal bleeding. Due for screening colonoscopy 2019.   Past Medical History:  Diagnosis Date  . Anxiety   . Cervical atypia, mild 12/2013   Sonohysterogram biopsy showed benign endometrium but inflamed mild atypia and squamous mucosa fragments recommend repeat Pap smear 6 months  . GERD (gastroesophageal reflux disease)   . HTN (hypertension)   . Irritable bowel syndrome   . Obesity   . Sleep apnea    C PAP  . Snoring   . URI (upper respiratory infection)     Past Surgical History:  Procedure Laterality Date  . 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 CESAREAN SECTION  10/2003  . CHOLECYSTECTOMY  2006  . COLONOSCOPY  2009   Dr. Oneida Alar: frequent sigmoid colon diverticula, small internal hemrorhoids, colonic biopsies to assess for microscopic colitis, Benign   . GYNECOLOGIC CRYOSURGERY    . MAXILLARY ANTROSTOMY Left 05/14/2016   Procedure: LEFT ENDOSCOPIC MAXILLARY ANTROSTOMY WITH FUSION NAVIGATION;  Surgeon: Leta Baptist, MD;  Location: [place];  Service: ENT;  Laterality: Left;  LEFT ENDOSCOPIC MAXILLARY ANTROSTOMY WITH FUSION NAVIGATION  . NASAL SEPTOPLASTY W/ TURBINOPLASTY Bilateral 05/14/2016   Procedure: NASAL SEPTOPLASTY WITH TURBINATE REDUCTION;  Surgeon: Leta Baptist, MD;  Location: [place];  Service: ENT;  Laterality: Bilateral;  NASAL SEPTOPLASTY WITH TURBINATE REDUCTION  . SINUS ENDO WITH FUSION Right 05/14/2016     Procedure: RIGHT ENDOSCOPIC  ETHMOIDECTOMY AND RIGHT ENDOSCOPIC FRONTAL SINUS EXPLORATION;  Surgeon: Leta Baptist, MD;  Location: [place];  Service: ENT;  Laterality: Right;  RIGHT ENDOSCOPIC  ETHMOIDECTOMY AND RIGHT ENDOSCOPIC FRONTAL SINUS EXPLORATION    Current Outpatient Prescriptions  Medication Sig Dispense Refill  . colestipol (COLESTID) 1 g tablet Take 1 tablet (1 g total) by mouth daily. 90 tablet 3  . esomeprazole (NEXIUM) 20 MG capsule Take 20 mg by mouth daily before breakfast.    . losartan (COZAAR) 100 MG tablet Take 1 tablet by mouth daily.    Marland Kitchen rOPINIRole (REQUIP) 0.25 MG tablet Take 1 tablet (0.25 mg total) by mouth at bedtime. (Patient taking differently: Take 0.25 mg by mouth as needed. ) 90 tablet 3   No current facility-administered medications for this visit.     Allergies as of 07/24/2017 - Review Complete 07/24/2017  Allergen Reaction Noted  . Penicillins Shortness Of Breath     Family History  Problem Relation Age of Onset  . Hypertension Father   . Heart attack Father   . Hypertension Sister   . Breast cancer Sister 43  . Hypertension Mother   . Cervical cancer Sister   . Colon polyps Neg Hx   . Colon cancer Neg Hx     Social History   Social History  . Marital status: Married    Spouse name: N/A  . Number of children: N/A  . Years  of education: N/A   Occupational History  . teacher assistant [place] History Main Topics  . 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 Smokeless tobacco: Never Used  . Alcohol use 0.0 oz/week     Comment: socially  . Drug use: No  . Sexual activity: Yes    Birth control/ protection: Other-see comments     Comment: vasectomy-1st intercourse 41 yo-5 partners   Other Topics Concern  . None   Social History Narrative   Drinks 1 cup of coffee and 12 oz ginger ale.    Review of Systems: As mentioned in HPI   Physical Exam: BP 129/76   Pulse 81   Temp 98.8 F (37.1 C) (Oral)   Ht  5\' 2"  (1.575 m)   Wt 222 lb 8 oz (100.9 kg)   LMP 12/05/2013   BMI 40.70 kg/m  General:   Alert and oriented. No distress noted. Pleasant and cooperative.  Head:  Normocephalic and atraumatic. Eyes:  Conjuctiva clear without scleral icterus. Mouth:  Oral mucosa pink and moist. Good dentition. No lesions. Abdomen:  +BS, soft, non-tender and non-distended. No rebound or guarding. No HSM or masses noted. Msk:  Symmetrical without gross deformities. Normal posture. Extremities:  Without edema. Neurologic:  Alert and  oriented x4 Psych:  Alert and cooperative. Normal mood and affect.

## 2017-07-25 ENCOUNTER — Encounter (HOSPITAL_COMMUNITY): Payer: Self-pay | Admitting: Occupational Therapy

## 2017-07-25 ENCOUNTER — Ambulatory Visit (HOSPITAL_COMMUNITY): Payer: BC Managed Care – PPO | Admitting: Occupational Therapy

## 2017-07-25 ENCOUNTER — Telehealth (HOSPITAL_COMMUNITY): Payer: Self-pay | Admitting: Family Medicine

## 2017-07-25 DIAGNOSIS — R29898 Other symptoms and signs involving the musculoskeletal system: Secondary | ICD-10-CM | POA: Diagnosis not present

## 2017-07-25 DIAGNOSIS — M25512 Pain in left shoulder: Secondary | ICD-10-CM

## 2017-07-25 DIAGNOSIS — M25612 Stiffness of left shoulder, not elsewhere classified: Secondary | ICD-10-CM

## 2017-07-25 NOTE — Progress Notes (Signed)
cc'ed to pcp °

## 2017-07-25 NOTE — Assessment & Plan Note (Signed)
Secondary to bile-salt diarrhea. Doing well with Colestid. No alarm features. Normal colonoscopy 2009. Triage for colonoscopy Jan 2019. Return in 1 year.

## 2017-07-25 NOTE — Therapy (Signed)
[place], [place], [address] Phone: [phone]   Fax:  [phone]  Occupational Therapy Treatment  Patient Details  Name: [name] MRN: [id number] Date of Birth: January 07, 1966 Referring Provider: Dr. Almedia Balls  Encounter Date: 07/25/2017      OT End of Session - 07/25/17 1534    Visit Number 3   Number of Visits 8   Date for OT Re-Evaluation 08/15/17   Authorization Type [place] $25 co-pay   OT Start Time 1432   OT Stop Time 1515   OT Time Calculation (min) 43 min   Activity Tolerance Patient tolerated treatment well   Behavior During Therapy [facility] for tasks assessed/performed      Past Medical History:  Diagnosis Date  . Anxiety   . Cervical atypia, mild 12/2013   Sonohysterogram biopsy showed benign endometrium but inflamed mild atypia and squamous mucosa fragments recommend repeat Pap smear 6 months  . GERD (gastroesophageal reflux disease)   . HTN (hypertension)   . Irritable bowel syndrome   . Obesity   . Sleep apnea    C PAP  . Snoring   . URI (upper respiratory infection)     Past Surgical History:  Procedure Laterality Date  . 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 CESAREAN SECTION  10/2003  . CHOLECYSTECTOMY  2006  . COLONOSCOPY  2009   Dr. Oneida Alar: frequent sigmoid colon diverticula, small internal hemrorhoids, colonic biopsies to assess for microscopic colitis, Benign   . GYNECOLOGIC CRYOSURGERY    . MAXILLARY ANTROSTOMY Left 05/14/2016   Procedure: LEFT ENDOSCOPIC MAXILLARY ANTROSTOMY WITH FUSION NAVIGATION;  Surgeon: Leta Baptist, MD;  Location: [place];  Service: ENT;  Laterality: Left;  LEFT ENDOSCOPIC MAXILLARY ANTROSTOMY WITH FUSION NAVIGATION  . NASAL SEPTOPLASTY W/ TURBINOPLASTY Bilateral 05/14/2016   Procedure: NASAL SEPTOPLASTY WITH TURBINATE REDUCTION;  Surgeon: Leta Baptist, MD;  Location: [place];  Service: ENT;   Laterality: Bilateral;  NASAL SEPTOPLASTY WITH TURBINATE REDUCTION  . SINUS ENDO WITH FUSION Right 05/14/2016   Procedure: RIGHT ENDOSCOPIC  ETHMOIDECTOMY AND RIGHT ENDOSCOPIC FRONTAL SINUS EXPLORATION;  Surgeon: Leta Baptist, MD;  Location: [place];  Service: ENT;  Laterality: Right;  RIGHT ENDOSCOPIC  ETHMOIDECTOMY AND RIGHT ENDOSCOPIC FRONTAL SINUS EXPLORATION    There were no vitals filed for this visit.      Subjective Assessment - 07/25/17 1426    Subjective  S: It's actually been doing really good.    Currently in Pain? No/denies            [facility] OT Assessment - 07/25/17 1426      Assessment   Diagnosis left frozen shoulder     Precautions   Precautions None                  OT Treatments/Exercises (OP) - 07/25/17 1435      Exercises   Exercises Shoulder     Shoulder Exercises: Supine   Protraction PROM;5 reps;AAROM;12 reps   Horizontal ABduction PROM;5 reps;AAROM;12 reps   External Rotation PROM;5 reps;AAROM;12 reps   Internal Rotation PROM;5 reps;AAROM;12 reps   Flexion PROM;5 reps;AAROM;12 reps   ABduction PROM;5 reps;AAROM;12 reps     Shoulder Exercises: Standing   Protraction AAROM;10 reps   Horizontal ABduction AAROM;10 reps   External Rotation AAROM;10 reps   Internal Rotation AAROM;10 reps   Flexion AAROM;10 reps   ABduction AAROM;10  reps   Extension Theraband;10 reps   Theraband Level (Shoulder Extension) Level 2 (Red)   Row Theraband;10 reps   Theraband Level (Shoulder Row) Level 2 (Red)   Retraction Theraband;10 reps   Theraband Level (Shoulder Retraction) Level 2 (Red)     Shoulder Exercises: ROM/Strengthening   Wall Wash 1'     Shoulder Exercises: Stretch   Internal Rotation Stretch 3 reps  10 seconds     Manual Therapy   Manual Therapy Myofascial release   Manual therapy comments completed separately from therapeutic exercise   Myofascial Release Myofascial release to left upper arm, trapezius, and scapularis  regions to decrease pain and fascial restrictions and increase joint range of motion                OT Education - 07/25/17 1500    Education provided Yes   Education Details AA/ROM exercises   Person(s) Educated Patient   Methods Explanation;Demonstration;Handout   Comprehension Verbalized understanding;Returned demonstration          OT Short Term Goals - 07/17/17 1011      OT SHORT TERM GOAL #1   Title Patient will be educated and independent with HEP to increase functional use of LUE during daily tasks.   Time 4   Period Weeks   Status On-going     OT SHORT TERM GOAL #2   Title Patient will increase A/ROM to WNL to increase ability to manage bra and reach overhead with less difficulty.   Time 4   Period Weeks   Status On-going     OT SHORT TERM GOAL #3   Title Patient will increase LUE strength to 4/5 to increase ability to return to normal daily household tasks while using LUE.   Time 4   Period Weeks   Status On-going     OT SHORT TERM GOAL #4   Title Pt will decrease pain level to 3/10 or less when using LUE during daily tasks.    Time 4   Period Weeks   Status On-going     OT SHORT TERM GOAL #5   Title Pt will decrease fascial restrictions in LUE to min amount or less to increase functional mobility needed to complete overhead reaching.   Time 4   Period Weeks   Status On-going                  Plan - 07/25/17 1534    Clinical Impression Statement A: Continued with manual therapy, AA/ROM, added wall wash, AA/ROM in standing, and scapular theraband this session. Verbal cuing required for form and technique intermittently, pt able to achieve ROM [facility] today. Pt experiences the most discomfort with flexion and abduction. Pt reports shoulder stretches are going well at home.    Plan P: Attempt A/ROM, continue with scapular theraband. Update HEP as appropriate.    OT Home Exercise Plan 8/21: shoulder stretches; 8/30: AA/ROM   Consulted and Agree  with Plan of Care Patient      Patient will benefit from skilled therapeutic intervention in order to improve the following deficits and impairments:  Pain, Increased fascial restricitons, Decreased range of motion, Decreased strength, Impaired UE functional use  Visit Diagnosis: Other symptoms and signs involving the musculoskeletal system  Acute pain of left shoulder  Stiffness of left shoulder, not elsewhere classified    Problem List Patient Active Problem List   Diagnosis Date Noted  . Insomnia due to psychological stress 02/14/2017  . RLS (restless legs  syndrome) 02/14/2017  . Rhinitis, allergic 04/11/2016  . Ethmoid sinusitis 04/11/2016  . OSA on CPAP 04/11/2016  . Snoring 10/27/2015  . Hypersomnia with sleep apnea 10/27/2015  . Insomnia w/ sleep apnea 10/27/2015  . Menopausal symptoms 10/27/2015  . OBESITY 02/09/2009  . GERD 02/09/2009  . Diarrhea 02/09/2009  . CHOLECYSTECTOMY, HX OF 02/09/2009   Guadelupe Sabin, OTR/L  [phone] 07/25/2017, 3:38 PM  [place] [address] [place], [place], [address] Phone: [phone]   Fax:  [phone]  Name: CHARDA JANIS MRN: [id number] Date of Birth: 1966-07-28

## 2017-07-25 NOTE — Telephone Encounter (Signed)
08/19/92 she had a conflict on 2/41 so we rescehduled appt to 9/10

## 2017-07-25 NOTE — Patient Instructions (Signed)
Perform each exercise ___10-15_____ reps. 2-3x days.   Protraction   Start by holding a wand or cane at chest height.  Next, slowly push the wand outwards in front of your body so that your elbows become fully straightened. Then, return to the original position.     Shoulder FLEXION   In the standing position, hold a wand/cane with both arms, palms up on both sides. Raise up the wand/cane allowing your unaffected arm to perform most of the effort. Your affected arm should be partially relaxed.      Internal/External ROTATION   In the standing position, hold a wand/cane with both hands keeping your elbows bent. Move your arms and wand/cane to one side.  Your affected arm should be partially relaxed while your unaffected arm performs most of the effort.       Shoulder ABDUCTION   While holding a wand/cane palm face up on the injured side and palm face down on the uninjured side, slowly raise up your injured arm to the side.                     Horizontal Abduction/Adduction      Straight arms holding cane at shoulder height, bring cane to right, center, left. Repeat starting to left.   Copyright  VHI. All rights reserved.

## 2017-07-30 ENCOUNTER — Ambulatory Visit (HOSPITAL_COMMUNITY): Payer: BC Managed Care – PPO | Attending: Orthopedic Surgery | Admitting: Occupational Therapy

## 2017-07-30 ENCOUNTER — Encounter (HOSPITAL_COMMUNITY): Payer: Self-pay | Admitting: Occupational Therapy

## 2017-07-30 DIAGNOSIS — R29898 Other symptoms and signs involving the musculoskeletal system: Secondary | ICD-10-CM | POA: Diagnosis not present

## 2017-07-30 DIAGNOSIS — M25612 Stiffness of left shoulder, not elsewhere classified: Secondary | ICD-10-CM | POA: Insufficient documentation

## 2017-07-30 DIAGNOSIS — M25512 Pain in left shoulder: Secondary | ICD-10-CM

## 2017-07-30 NOTE — Patient Instructions (Signed)

## 2017-07-30 NOTE — Therapy (Signed)
[place], [place], [address] Phone: [phone]   Fax:  [phone]  Occupational Therapy Treatment  Patient Details  Name: [name] MRN: [id number] Date of Birth: December 05, 1965 Referring Provider: Dr. Almedia Balls  Encounter Date: 07/30/2017      OT End of Session - 07/30/17 1726    Visit Number 4   Number of Visits 8   Date for OT Re-Evaluation 08/15/17   Authorization Type [place] $25 co-pay   OT Start Time 1645   OT Stop Time 1724   OT Time Calculation (min) 39 min   Activity Tolerance Patient tolerated treatment well   Behavior During Therapy [facility] for tasks assessed/performed      Past Medical History:  Diagnosis Date  . Anxiety   . Cervical atypia, mild 12/2013   Sonohysterogram biopsy showed benign endometrium but inflamed mild atypia and squamous mucosa fragments recommend repeat Pap smear 6 months  . GERD (gastroesophageal reflux disease)   . HTN (hypertension)   . Irritable bowel syndrome   . Obesity   . Sleep apnea    C PAP  . Snoring   . URI (upper respiratory infection)     Past Surgical History:  Procedure Laterality Date  . 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 CESAREAN SECTION  10/2003  . CHOLECYSTECTOMY  2006  . COLONOSCOPY  2009   Dr. Oneida Alar: frequent sigmoid colon diverticula, small internal hemrorhoids, colonic biopsies to assess for microscopic colitis, Benign   . GYNECOLOGIC CRYOSURGERY    . MAXILLARY ANTROSTOMY Left 05/14/2016   Procedure: LEFT ENDOSCOPIC MAXILLARY ANTROSTOMY WITH FUSION NAVIGATION;  Surgeon: Leta Baptist, MD;  Location: [place];  Service: ENT;  Laterality: Left;  LEFT ENDOSCOPIC MAXILLARY ANTROSTOMY WITH FUSION NAVIGATION  . NASAL SEPTOPLASTY W/ TURBINOPLASTY Bilateral 05/14/2016   Procedure: NASAL SEPTOPLASTY WITH TURBINATE REDUCTION;  Surgeon: Leta Baptist, MD;  Location: [place];  Service: ENT;   Laterality: Bilateral;  NASAL SEPTOPLASTY WITH TURBINATE REDUCTION  . SINUS ENDO WITH FUSION Right 05/14/2016   Procedure: RIGHT ENDOSCOPIC  ETHMOIDECTOMY AND RIGHT ENDOSCOPIC FRONTAL SINUS EXPLORATION;  Surgeon: Leta Baptist, MD;  Location: [place];  Service: ENT;  Laterality: Right;  RIGHT ENDOSCOPIC  ETHMOIDECTOMY AND RIGHT ENDOSCOPIC FRONTAL SINUS EXPLORATION    There were no vitals filed for this visit.      Subjective Assessment - 07/30/17 1644    Subjective  S: I've been doing stretches throughout the day.    Currently in Pain? No/denies            [facility] OT Assessment - 07/30/17 1644      Assessment   Diagnosis left frozen shoulder     Precautions   Precautions None                  OT Treatments/Exercises (OP) - 07/30/17 1648      Exercises   Exercises Shoulder     Shoulder Exercises: Supine   Protraction PROM;5 reps;AROM;10 reps   Horizontal ABduction PROM;5 reps;AROM;10 reps   External Rotation PROM;5 reps;AROM;10 reps   Internal Rotation PROM;5 reps;AROM;10 reps   Flexion PROM;5 reps;AROM;10 reps   ABduction PROM;5 reps;AROM;10 reps     Shoulder Exercises: Standing   Protraction AROM;10 reps   Horizontal ABduction AROM;10 reps   External Rotation AROM;10 reps   Internal Rotation AROM;10 reps   Flexion AROM;10 reps   ABduction  AROM;10 reps   Extension Theraband;10 reps   Theraband Level (Shoulder Extension) Level 2 (Red)   Row Theraband;10 reps   Theraband Level (Shoulder Row) Level 2 (Red)   Retraction Theraband;10 reps   Theraband Level (Shoulder Retraction) Level 2 (Red)     Shoulder Exercises: ROM/Strengthening   Proximal Shoulder Strengthening, Supine 10X each no rest breaks   Proximal Shoulder Strengthening, Seated 10X each no rest breaks     Manual Therapy   Manual Therapy Myofascial release   Manual therapy comments completed separately from therapeutic exercise   Myofascial Release Myofascial release to left upper  arm, trapezius, and scapularis regions to decrease pain and fascial restrictions and increase joint range of motion                OT Education - 07/30/17 1717    Education provided Yes   Education Details A/ROM   Person(s) Educated Patient   Methods Explanation;Demonstration;Handout   Comprehension Verbalized understanding;Returned demonstration          OT Short Term Goals - 07/17/17 1011      OT SHORT TERM GOAL #1   Title Patient will be educated and independent with HEP to increase functional use of LUE during daily tasks.   Time 4   Period Weeks   Status On-going     OT SHORT TERM GOAL #2   Title Patient will increase A/ROM to WNL to increase ability to manage bra and reach overhead with less difficulty.   Time 4   Period Weeks   Status On-going     OT SHORT TERM GOAL #3   Title Patient will increase LUE strength to 4/5 to increase ability to return to normal daily household tasks while using LUE.   Time 4   Period Weeks   Status On-going     OT SHORT TERM GOAL #4   Title Pt will decrease pain level to 3/10 or less when using LUE during daily tasks.    Time 4   Period Weeks   Status On-going     OT SHORT TERM GOAL #5   Title Pt will decrease fascial restrictions in LUE to min amount or less to increase functional mobility needed to complete overhead reaching.   Time 4   Period Weeks   Status On-going                  Plan - 07/30/17 1727    Clinical Impression Statement A: Progressed to A/ROM this session, pt with ROM WFL in supine and standing, added proximal shoulder strengthening. Intermittent verbal cuing for form and technique, pt reporting little to no pain throughout session including during flexion and abduction. Updated HEP for A/ROM.    Plan P: Continue A/ROM, add x to v arms and UBE. Follow up on HEP and add scapular theraband to HEP.    OT Home Exercise Plan 8/21: shoulder stretches; 8/30: AA/ROM; 9/4: A/ROM and discontinue AA/ROM    Consulted and Agree with Plan of Care Patient      Patient will benefit from skilled therapeutic intervention in order to improve the following deficits and impairments:  Pain, Increased fascial restricitons, Decreased range of motion, Decreased strength, Impaired UE functional use  Visit Diagnosis: Other symptoms and signs involving the musculoskeletal system  Acute pain of left shoulder  Stiffness of left shoulder, not elsewhere classified    Problem List Patient Active Problem List   Diagnosis Date Noted  . Insomnia due to psychological stress  02/14/2017  . RLS (restless legs syndrome) 02/14/2017  . Rhinitis, allergic 04/11/2016  . Ethmoid sinusitis 04/11/2016  . OSA on CPAP 04/11/2016  . Snoring 10/27/2015  . Hypersomnia with sleep apnea 10/27/2015  . Insomnia w/ sleep apnea 10/27/2015  . Menopausal symptoms 10/27/2015  . OBESITY 02/09/2009  . GERD 02/09/2009  . Diarrhea 02/09/2009  . CHOLECYSTECTOMY, HX OF 02/09/2009   Guadelupe Sabin, OTR/L  [phone] 07/30/2017, 5:29 PM  [facility] [facility] [address] [place], [place], [address] Phone: [phone]   Fax:  [phone]  Name: [name] MRN: [id number] Date of Birth: 04/23/1966

## 2017-08-01 ENCOUNTER — Ambulatory Visit (HOSPITAL_COMMUNITY): Payer: BC Managed Care – PPO

## 2017-08-01 ENCOUNTER — Ambulatory Visit (HOSPITAL_COMMUNITY): Payer: BC Managed Care – PPO | Admitting: Occupational Therapy

## 2017-08-01 ENCOUNTER — Encounter (HOSPITAL_COMMUNITY): Payer: Self-pay | Admitting: Occupational Therapy

## 2017-08-01 DIAGNOSIS — M25612 Stiffness of left shoulder, not elsewhere classified: Secondary | ICD-10-CM

## 2017-08-01 DIAGNOSIS — M25512 Pain in left shoulder: Secondary | ICD-10-CM

## 2017-08-01 DIAGNOSIS — R29898 Other symptoms and signs involving the musculoskeletal system: Secondary | ICD-10-CM | POA: Diagnosis not present

## 2017-08-01 NOTE — Therapy (Signed)
[name], [place], [address] Phone: [phone]   Fax:  [phone]  Occupational Therapy Treatment  Patient Details  Name: [name] MRN: [id number] Date of Birth: 1966-11-24 Referring Provider: Dr. Almedia Balls  Encounter Date: 08/01/2017      OT End of Session - 08/01/17 1649    Visit Number 5   Number of Visits 8   Date for OT Re-Evaluation 08/15/17   Authorization Type [place] $25 co-pay   OT Start Time 1609   OT Stop Time 1652   OT Time Calculation (min) 43 min   Activity Tolerance Patient tolerated treatment well   Behavior During Therapy [facility] for tasks assessed/performed      Past Medical History:  Diagnosis Date  . Anxiety   . Cervical atypia, mild 12/2013   Sonohysterogram biopsy showed benign endometrium but inflamed mild atypia and squamous mucosa fragments recommend repeat Pap smear 6 months  . GERD (gastroesophageal reflux disease)   . HTN (hypertension)   . Irritable bowel syndrome   . Obesity   . Sleep apnea    C PAP  . Snoring   . URI (upper respiratory infection)     Past Surgical History:  Procedure Laterality Date  . 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 CESAREAN SECTION  10/2003  . CHOLECYSTECTOMY  2006  . COLONOSCOPY  2009   Dr. Oneida Alar: frequent sigmoid colon diverticula, small internal hemrorhoids, colonic biopsies to assess for microscopic colitis, Benign   . GYNECOLOGIC CRYOSURGERY    . MAXILLARY ANTROSTOMY Left 05/14/2016   Procedure: LEFT ENDOSCOPIC MAXILLARY ANTROSTOMY WITH FUSION NAVIGATION;  Surgeon: Leta Baptist, MD;  Location: [place];  Service: ENT;  Laterality: Left;  LEFT ENDOSCOPIC MAXILLARY ANTROSTOMY WITH FUSION NAVIGATION  . NASAL SEPTOPLASTY W/ TURBINOPLASTY Bilateral 05/14/2016   Procedure: NASAL SEPTOPLASTY WITH TURBINATE REDUCTION;  Surgeon: Leta Baptist, MD;  Location: [place];  Service: ENT;   Laterality: Bilateral;  NASAL SEPTOPLASTY WITH TURBINATE REDUCTION  . SINUS ENDO WITH FUSION Right 05/14/2016   Procedure: RIGHT ENDOSCOPIC  ETHMOIDECTOMY AND RIGHT ENDOSCOPIC FRONTAL SINUS EXPLORATION;  Surgeon: Leta Baptist, MD;  Location: [place];  Service: ENT;  Laterality: Right;  RIGHT ENDOSCOPIC  ETHMOIDECTOMY AND RIGHT ENDOSCOPIC FRONTAL SINUS EXPLORATION    There were no vitals filed for this visit.      Subjective Assessment - 08/01/17 1609    Subjective  S: I can tell it's feeling a lot better.    Currently in Pain? No/denies            [facility] OT Assessment - 08/01/17 1608      Assessment   Diagnosis left frozen shoulder     Precautions   Precautions None                  OT Treatments/Exercises (OP) - 08/01/17 1611      Exercises   Exercises Shoulder     Shoulder Exercises: Supine   Protraction PROM;5 reps;AROM;10 reps   Horizontal ABduction PROM;5 reps;AROM;10 reps   External Rotation PROM;5 reps;AROM;10 reps   Internal Rotation PROM;5 reps;AROM;10 reps   Flexion PROM;5 reps;AROM;10 reps   ABduction PROM;5 reps;AROM;10 reps     Shoulder Exercises: Standing   Protraction AROM;10 reps   Horizontal ABduction AROM;10 reps   External Rotation AROM;10 reps   Internal Rotation AROM;10 reps   Flexion AROM;10 reps  ABduction AROM;10 reps   Extension Theraband;10 reps   Theraband Level (Shoulder Extension) Level 2 (Red)   Row Theraband;10 reps   Theraband Level (Shoulder Row) Level 2 (Red)   Retraction Theraband;10 reps   Theraband Level (Shoulder Retraction) Level 2 (Red)     Shoulder Exercises: ROM/Strengthening   UBE (Upper Arm Bike) Level 1 2' forward 2' reverse   X to V Arms 10X   Proximal Shoulder Strengthening, Supine 10X each no rest breaks   Proximal Shoulder Strengthening, Seated 10X each no rest breaks     Manual Therapy   Manual Therapy Myofascial release   Manual therapy comments completed separately from therapeutic  exercise   Myofascial Release Myofascial release to left upper arm, trapezius, and scapularis regions to decrease pain and fascial restrictions and increase joint range of motion                OT Education - 08/01/17 1640    Education provided Yes   Education Details scapular theraband   Person(s) Educated Patient   Methods Explanation;Demonstration;Handout   Comprehension Verbalized understanding;Returned demonstration          OT Short Term Goals - 07/17/17 1011      OT SHORT TERM GOAL #1   Title Patient will be educated and independent with HEP to increase functional use of LUE during daily tasks.   Time 4   Period Weeks   Status On-going     OT SHORT TERM GOAL #2   Title Patient will increase A/ROM to WNL to increase ability to manage bra and reach overhead with less difficulty.   Time 4   Period Weeks   Status On-going     OT SHORT TERM GOAL #3   Title Patient will increase LUE strength to 4/5 to increase ability to return to normal daily household tasks while using LUE.   Time 4   Period Weeks   Status On-going     OT SHORT TERM GOAL #4   Title Pt will decrease pain level to 3/10 or less when using LUE during daily tasks.    Time 4   Period Weeks   Status On-going     OT SHORT TERM GOAL #5   Title Pt will decrease fascial restrictions in LUE to min amount or less to increase functional mobility needed to complete overhead reaching.   Time 4   Period Weeks   Status On-going                  Plan - 08/01/17 1653    Clinical Impression Statement A: Continued with A/ROM, pt able to tolerate P/ROM WNL and achieve A/ROM WNL, slight increase in pain with abduction. Added x to v arms and UBE, no pain with new exercises. Occasional verbal cuing for form during session. Pt reports HEP is going well, added red scapular theraband to HEP.    Plan P: Add 1# weight to supine and standing if pt able to tolerate. Continue with LUE strengthening and scapular  strengthening exercises. Follow up on HEP   OT Home Exercise Plan 8/21: shoulder stretches; 8/30: AA/ROM; 9/4: A/ROM and discontinue AA/ROM; 9/6: added scapular theraband   Consulted and Agree with Plan of Care Patient      Patient will benefit from skilled therapeutic intervention in order to improve the following deficits and impairments:  Pain, Increased fascial restricitons, Decreased range of motion, Decreased strength, Impaired UE functional use  Visit Diagnosis: Other symptoms and signs involving  the musculoskeletal system  Acute pain of left shoulder  Stiffness of left shoulder, not elsewhere classified    Problem List Patient Active Problem List   Diagnosis Date Noted  . Insomnia due to psychological stress 02/14/2017  . RLS (restless legs syndrome) 02/14/2017  . Rhinitis, allergic 04/11/2016  . Ethmoid sinusitis 04/11/2016  . OSA on CPAP 04/11/2016  . Snoring 10/27/2015  . Hypersomnia with sleep apnea 10/27/2015  . Insomnia w/ sleep apnea 10/27/2015  . Menopausal symptoms 10/27/2015  . OBESITY 02/09/2009  . GERD 02/09/2009  . Diarrhea 02/09/2009  . CHOLECYSTECTOMY, HX OF 02/09/2009   Guadelupe Sabin, OTR/L  [phone] 08/01/2017, 4:56 PM  [facility] [address] [place], [place], [address] Phone: [phone]   Fax:  [phone]  Name: [name] MRN: [id number] Date of Birth: 1966/06/08

## 2017-08-01 NOTE — Patient Instructions (Signed)
(  Home) Extension: Isometric / Bilateral Arm Retraction - Sitting   Facing anchor, hold hands and elbow at shoulder height, with elbow bent.  Pull arms back to squeeze shoulder blades together. Repeat 10-15 times. 1-3 times/day.   Copyright  VHI. All rights reserved.   (Home) Retraction: Row - Bilateral (Anchor)   Facing anchor, arms reaching forward, pull hands toward stomach, keeping elbows bent and at your sides and pinching shoulder blades together. Repeat 10-15 times. 1-3 times/day.   Copyright  VHI. All rights reserved.   (Clinic) Extension / Flexion (Assist)   Face anchor, pull arms back, keeping elbow straight, and squeze shoulder blades together. Repeat 10-15 times. 1-3 times/day.   Copyright  VHI. All rights reserved.  

## 2017-08-05 ENCOUNTER — Encounter (HOSPITAL_COMMUNITY): Payer: Self-pay | Admitting: Specialist

## 2017-08-05 ENCOUNTER — Ambulatory Visit (HOSPITAL_COMMUNITY): Payer: BC Managed Care – PPO | Admitting: Specialist

## 2017-08-05 DIAGNOSIS — R29898 Other symptoms and signs involving the musculoskeletal system: Secondary | ICD-10-CM | POA: Diagnosis not present

## 2017-08-05 DIAGNOSIS — M25612 Stiffness of left shoulder, not elsewhere classified: Secondary | ICD-10-CM

## 2017-08-05 DIAGNOSIS — M25512 Pain in left shoulder: Secondary | ICD-10-CM

## 2017-08-05 NOTE — Therapy (Signed)
[place], [place], [address] Phone: [phone]   Fax:  [phone]  Occupational Therapy Treatment  Patient Details  Name: [name] MRN: [id number] Date of Birth: 04-07-1966 Referring Provider: Dr. Almedia Balls  Encounter Date: 08/05/2017      OT End of Session - 08/05/17 1608    Visit Number 6   Number of Visits 8   Date for OT Re-Evaluation 08/15/17   Authorization Type [place] $25 co-pay   OT Start Time 1520   OT Stop Time 1550   OT Time Calculation (min) 30 min   Activity Tolerance Patient tolerated treatment well   Behavior During Therapy [facility] for tasks assessed/performed      Past Medical History:  Diagnosis Date  . Anxiety   . Cervical atypia, mild 12/2013   Sonohysterogram biopsy showed benign endometrium but inflamed mild atypia and squamous mucosa fragments recommend repeat Pap smear 6 months  . GERD (gastroesophageal reflux disease)   . HTN (hypertension)   . Irritable bowel syndrome   . Obesity   . Sleep apnea    C PAP  . Snoring   . URI (upper respiratory infection)     Past Surgical History:  Procedure Laterality Date  . 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 CESAREAN SECTION  10/2003  . CHOLECYSTECTOMY  2006  . COLONOSCOPY  2009   Dr. Oneida Alar: frequent sigmoid colon diverticula, small internal hemrorhoids, colonic biopsies to assess for microscopic colitis, Benign   . GYNECOLOGIC CRYOSURGERY    . MAXILLARY ANTROSTOMY Left 05/14/2016   Procedure: LEFT ENDOSCOPIC MAXILLARY ANTROSTOMY WITH FUSION NAVIGATION;  Surgeon: Leta Baptist, MD;  Location: [place];  Service: ENT;  Laterality: Left;  LEFT ENDOSCOPIC MAXILLARY ANTROSTOMY WITH FUSION NAVIGATION  . NASAL SEPTOPLASTY W/ TURBINOPLASTY Bilateral 05/14/2016   Procedure: NASAL SEPTOPLASTY WITH TURBINATE REDUCTION;  Surgeon: Leta Baptist, MD;  Location: [place];  Service: ENT;   Laterality: Bilateral;  NASAL SEPTOPLASTY WITH TURBINATE REDUCTION  . SINUS ENDO WITH FUSION Right 05/14/2016   Procedure: RIGHT ENDOSCOPIC  ETHMOIDECTOMY AND RIGHT ENDOSCOPIC FRONTAL SINUS EXPLORATION;  Surgeon: Leta Baptist, MD;  Location: [place];  Service: ENT;  Laterality: Right;  RIGHT ENDOSCOPIC  ETHMOIDECTOMY AND RIGHT ENDOSCOPIC FRONTAL SINUS EXPLORATION    There were no vitals filed for this visit.      Subjective Assessment - 08/05/17 1607    Subjective  S:  the only thing that's a little sore to complete is fastening my bra.  I can do it, its just a bit sore.    Special Tests FOTO score was 45/100 currently 94/100   Currently in Pain? No/denies            [facility] OT Assessment - 08/05/17 0001      Assessment   Diagnosis left frozen shoulder     Precautions   Precautions None     ADL   ADL comments Patient has resumed prior level of independence wtih all daily tasks, her only remaining deficit is fastening her bra causes soreness in her shoulder      Palpation   Palpation comment trace fascial restrictions      AROM   Overall AROM Comments assessed in seated, external and internal rotation with shoulder adducted    Left Shoulder Flexion 165 Degrees  130   Left Shoulder ABduction 155 Degrees  86   Left Shoulder  Internal Rotation 90 Degrees  75   Left Shoulder External Rotation 75 Degrees  60     PROM   Overall PROM Comments P/ROM is WNL in supine     Strength   Left Shoulder Flexion 4+/5  3/5   Left Shoulder ABduction 5/5  3-/5   Left Shoulder Internal Rotation 5/5  3/5   Left Shoulder External Rotation 5/5  3-/5                  OT Treatments/Exercises (OP) - 08/05/17 0001      Shoulder Exercises: Supine   Protraction PROM;5 reps   Horizontal ABduction PROM;5 reps   External Rotation PROM;5 reps   Internal Rotation PROM;5 reps   Flexion PROM;5 reps   ABduction PROM;5 reps     Manual Therapy   Manual Therapy  Myofascial release   Manual therapy comments completed separately from therapeutic exercise   Myofascial Release Myofascial release to left upper arm, trapezius, and scapularis regions to decrease pain and fascial restrictions and increase joint range of motion                OT Education - 08/05/17 1608    Education provided Yes   Education Details internal rotation stretches   Person(s) Educated Patient   Methods Explanation;Demonstration;Handout   Comprehension Verbalized understanding;Returned demonstration          OT Short Term Goals - 08/05/17 1541      OT SHORT TERM GOAL #1   Title Patient will be educated and independent with HEP to increase functional use of LUE during daily tasks.   Time 4   Period Weeks   Status Achieved     OT SHORT TERM GOAL #2   Title Patient will increase A/ROM to WNL to increase ability to manage bra and reach overhead with less difficulty.   Time 4   Period Weeks   Status Achieved     OT SHORT TERM GOAL #3   Title Patient will increase LUE strength to 4/5 to increase ability to return to normal daily household tasks while using LUE.   Time 4   Period Weeks   Status Achieved     OT SHORT TERM GOAL #4   Title Pt will decrease pain level to 3/10 or less when using LUE during daily tasks.    Time 4   Period Weeks   Status Achieved     OT SHORT TERM GOAL #5   Title Pt will decrease fascial restrictions in LUE to min amount or less to increase functional mobility needed to complete overhead reaching.   Time 4   Period Weeks   Status Achieved                  Plan - 08/05/17 1608    Clinical Impression Statement A:  Patient has made significant improvements in A/ROM, P/ROM, and strength, as well as decreases in pain level and fascial restrictions.  Patient states functional level has returned to prior level of independence, and her only remaining deficit is soreness when fastening her bra.  She was educated on 2  stretches to improve ability to reach to her mid back into internal rotation.     Plan P:  DC from skilled OT intervention this date, as all goals have been met.     OT Home Exercise Plan 8/21: shoulder stretches; 8/30: AA/ROM; 9/4: A/ROM and discontinue AA/ROM; 9/6: added scapular theraband; 08/05/17:  internal rotation stretches  Consulted and Agree with Plan of Care Patient      Patient will benefit from skilled therapeutic intervention in order to improve the following deficits and impairments:  Pain, Increased fascial restricitons, Decreased range of motion, Decreased strength, Impaired UE functional use  Visit Diagnosis: Other symptoms and signs involving the musculoskeletal system  Acute pain of left shoulder  Stiffness of left shoulder, not elsewhere classified    Problem List Patient Active Problem List   Diagnosis Date Noted  . Insomnia due to psychological stress 02/14/2017  . RLS (restless legs syndrome) 02/14/2017  . Rhinitis, allergic 04/11/2016  . Ethmoid sinusitis 04/11/2016  . OSA on CPAP 04/11/2016  . Snoring 10/27/2015  . Hypersomnia with sleep apnea 10/27/2015  . Insomnia w/ sleep apnea 10/27/2015  . Menopausal symptoms 10/27/2015  . OBESITY 02/09/2009  . GERD 02/09/2009  . Diarrhea 02/09/2009  . CHOLECYSTECTOMY, HX OF 02/09/2009    Vangie Bicker, [place], OTR/L [phone]  08/05/2017, 4:17 PM OCCUPATIONAL THERAPY DISCHARGE SUMMARY  Visits from Start of Care: 6  Current functional level related to goals / functional outcomes: Independent with all activities, soreness with fastening bra in back, however she is able to complete this activity.    Remaining deficits: Soreness with combined internal rotation and abduction.   Education / Equipment: Internal rotation stretches.  Plan: Patient agrees to discharge.  Patient goals were met. Patient is being discharged due to meeting the stated rehab goals.  ?????         Vangie Bicker, [place],  OTR/L [place] [address] [place], [place], [address] Phone: [phone]   Fax:  [phone]  Name: CELENA LANIUS MRN: [id number] Date of Birth: 10/24/1966

## 2017-08-05 NOTE — Patient Instructions (Signed)
Chest / Shoulder Stretch: Yardstick Along Spine    Hold stick horizontally against buttocks, hands shoulder width apart. Bend elbows, and raise stick along spine. Hold 1 count. Slowly return to starting position. Repeat ____ times.  Copyright  VHI. All rights reserved.  Shoulder Internal Rotation    Standing, feet shoulder width apart, grasp club with one hand palm forward, arm extended above head, and other hand palm back behind back, arm bent elbow down. Pull gently upward. Hold ____ seconds. Switch arms and repeat. Repeat ____ times. Do ____ sessions per day.  Copyright  VHI. All rights reserved.

## 2017-08-06 ENCOUNTER — Encounter (HOSPITAL_COMMUNITY): Payer: BC Managed Care – PPO | Admitting: Occupational Therapy

## 2017-08-08 ENCOUNTER — Ambulatory Visit (HOSPITAL_COMMUNITY): Payer: BC Managed Care – PPO | Admitting: Occupational Therapy

## 2017-08-12 ENCOUNTER — Ambulatory Visit (INDEPENDENT_AMBULATORY_CARE_PROVIDER_SITE_OTHER): Payer: BC Managed Care – PPO | Admitting: Otolaryngology

## 2017-08-12 DIAGNOSIS — J310 Chronic rhinitis: Secondary | ICD-10-CM

## 2017-08-23 ENCOUNTER — Ambulatory Visit (HOSPITAL_COMMUNITY)
Admission: RE | Admit: 2017-08-23 | Payer: BC Managed Care – PPO | Source: Ambulatory Visit | Admitting: Gastroenterology

## 2017-08-23 ENCOUNTER — Encounter (HOSPITAL_COMMUNITY): Admission: RE | Payer: Self-pay | Source: Ambulatory Visit

## 2017-08-23 SURGERY — COLONOSCOPY
Anesthesia: Moderate Sedation

## 2017-10-31 ENCOUNTER — Encounter: Payer: Self-pay | Admitting: Gastroenterology

## 2018-01-08 NOTE — Progress Notes (Signed)
REVIEWED-NO ADDITIONAL RECOMMENDATIONS. 

## 2018-02-10 ENCOUNTER — Ambulatory Visit (INDEPENDENT_AMBULATORY_CARE_PROVIDER_SITE_OTHER): Payer: BC Managed Care – PPO | Admitting: Otolaryngology

## 2018-02-10 DIAGNOSIS — J310 Chronic rhinitis: Secondary | ICD-10-CM

## 2018-05-14 ENCOUNTER — Other Ambulatory Visit: Payer: Self-pay | Admitting: Gynecology

## 2018-05-14 DIAGNOSIS — Z1231 Encounter for screening mammogram for malignant neoplasm of breast: Secondary | ICD-10-CM

## 2018-05-20 ENCOUNTER — Encounter: Payer: Self-pay | Admitting: Gastroenterology

## 2018-06-13 ENCOUNTER — Ambulatory Visit
Admission: RE | Admit: 2018-06-13 | Discharge: 2018-06-13 | Disposition: A | Discharge status: Home / Self Care | Payer: BC Managed Care – PPO | Source: Ambulatory Visit | Attending: Gynecology | Admitting: Gynecology

## 2018-06-13 DIAGNOSIS — Z1231 Encounter for screening mammogram for malignant neoplasm of breast: Secondary | ICD-10-CM

## 2018-08-11 ENCOUNTER — Ambulatory Visit (INDEPENDENT_AMBULATORY_CARE_PROVIDER_SITE_OTHER): Payer: BC Managed Care – PPO | Admitting: Otolaryngology

## 2018-10-18 ENCOUNTER — Other Ambulatory Visit: Payer: Self-pay | Admitting: Gastroenterology

## 2018-11-25 ENCOUNTER — Telehealth: Payer: Self-pay | Admitting: *Deleted

## 2018-11-25 ENCOUNTER — Encounter: Payer: Self-pay | Admitting: *Deleted

## 2018-11-25 ENCOUNTER — Ambulatory Visit (INDEPENDENT_AMBULATORY_CARE_PROVIDER_SITE_OTHER): Payer: BC Managed Care – PPO | Admitting: Gastroenterology

## 2018-11-25 ENCOUNTER — Encounter: Payer: Self-pay | Admitting: Gastroenterology

## 2018-11-25 ENCOUNTER — Other Ambulatory Visit: Payer: Self-pay | Admitting: *Deleted

## 2018-11-25 VITALS — BP 124/85 | HR 75 | Temp 97.9°F | Ht 62.0 in | Wt 227.2 lb

## 2018-11-25 DIAGNOSIS — K219 Gastro-esophageal reflux disease without esophagitis: Secondary | ICD-10-CM | POA: Diagnosis not present

## 2018-11-25 DIAGNOSIS — Z1211 Encounter for screening for malignant neoplasm of colon: Secondary | ICD-10-CM | POA: Insufficient documentation

## 2018-11-25 DIAGNOSIS — R197 Diarrhea, unspecified: Secondary | ICD-10-CM | POA: Diagnosis not present

## 2018-11-25 NOTE — Progress Notes (Signed)
Primary Care Physician:  Sharilyn Sites, MD  Primary Gastroenterologist:  Barney Drain, MD   Chief Complaint  Patient presents with  . Colonoscopy    consult    HPI:  [name] is a 52 y.o. female here for follow-up of chronic diarrhea, GERD, schedule screening colonoscopy.  Last seen in August 2018.    She takes Colestid once every morning, typically has a couple bowel movements per day.  If she does not take it then her stools are unpredictable and can be urgent.  Denies any melena or rectal bleeding.  No abdominal pain.  No nausea or vomiting.  No dysphagia.  Reflux is well controlled with over-the-counter Nexium, takes only as needed.  Typically will take if she plans to eat something spicy or eats later in the day.  Current Outpatient Medications  Medication Sig Dispense Refill  . colestipol (COLESTID) 1 g tablet TAKE 1 TABLET(1 GRAM) BY MOUTH DAILY 90 tablet 1  . esomeprazole (NEXIUM) 20 MG capsule Take 20 mg by mouth daily before breakfast.    . losartan (COZAAR) 100 MG tablet Take 1 tablet by mouth daily.    Marland Kitchen rOPINIRole (REQUIP) 0.25 MG tablet Take 1 tablet (0.25 mg total) by mouth at bedtime. (Patient taking differently: Take 0.25 mg by mouth as needed. ) 90 tablet 3  . sertraline (ZOLOFT) 50 MG tablet Take 1 tablet by mouth daily.     No current facility-administered medications for this visit.     Allergies as of 11/25/2018 - Review Complete 11/25/2018  Allergen Reaction Noted  . Penicillins Shortness Of Breath     Past Medical History:  Diagnosis Date  . Anxiety   . Cervical atypia, mild 12/2013   Sonohysterogram biopsy showed benign endometrium but inflamed mild atypia and squamous mucosa fragments recommend repeat Pap smear 6 months  . GERD (gastroesophageal reflux disease)   . HTN (hypertension)   . Irritable bowel syndrome   . Obesity   . Sleep apnea    C PAP  . Snoring   . URI (upper respiratory infection)     Past Surgical History:  Procedure  Laterality Date  . 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 CESAREAN SECTION  10/2003  . CHOLECYSTECTOMY  2006  . COLONOSCOPY  2009   Dr. Oneida Alar: frequent sigmoid colon diverticula, small internal hemrorhoids, colonic biopsies to assess for microscopic colitis, Benign   . GYNECOLOGIC CRYOSURGERY    . MAXILLARY ANTROSTOMY Left 05/14/2016   Procedure: LEFT ENDOSCOPIC MAXILLARY ANTROSTOMY WITH FUSION NAVIGATION;  Surgeon: Leta Baptist, MD;  Location: [place];  Service: ENT;  Laterality: Left;  LEFT ENDOSCOPIC MAXILLARY ANTROSTOMY WITH FUSION NAVIGATION  . NASAL SEPTOPLASTY W/ TURBINOPLASTY Bilateral 05/14/2016   Procedure: NASAL SEPTOPLASTY WITH TURBINATE REDUCTION;  Surgeon: Leta Baptist, MD;  Location: [place];  Service: ENT;  Laterality: Bilateral;  NASAL SEPTOPLASTY WITH TURBINATE REDUCTION  . SINUS ENDO WITH FUSION Right 05/14/2016   Procedure: RIGHT ENDOSCOPIC  ETHMOIDECTOMY AND RIGHT ENDOSCOPIC FRONTAL SINUS EXPLORATION;  Surgeon: Leta Baptist, MD;  Location: [place];  Service: ENT;  Laterality: Right;  RIGHT ENDOSCOPIC  ETHMOIDECTOMY AND RIGHT ENDOSCOPIC FRONTAL SINUS EXPLORATION    Family History  Problem Relation Age of Onset  . Hypertension Father   . Heart attack Father   . Hypertension Sister   . Breast cancer Sister 26  . Hypertension Mother   . Cervical cancer Sister   . Colon polyps  Neg Hx   . Colon cancer Neg Hx     Social History   Socioeconomic History  . Marital status: Married    Spouse name: Not on file  . Number of children: Not on file  . Years of education: Not on file  . Highest education level: Not on file  Occupational History  . Occupation: Financial planner: [employer]  . Financial resource strain: Not on file  . Food insecurity:    Worry: Not on file    Inability: Not on file  . Transportation needs:    Medical: Not on file    Non-medical: Not on file  Tobacco Use   . 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 Smokeless tobacco: Never Used  Substance and Sexual Activity  . Alcohol use: Yes    Alcohol/week: 0.0 standard drinks    Comment: socially  . Drug use: No  . Sexual activity: Yes    Birth control/protection: Other-see comments    Comment: vasectomy-1st intercourse 19 yo-5 partners  Lifestyle  . Physical activity:    Days per week: Not on file    Minutes per session: Not on file  . Stress: Not on file  Relationships  . Social connections:    Talks on phone: Not on file    Gets together: Not on file    Attends religious service: Not on file    Active member of club or organization: Not on file    Attends meetings of clubs or organizations: Not on file    Relationship status: Not on file  . Intimate partner violence:    Fear of current or ex partner: Not on file    Emotionally abused: Not on file    Physically abused: Not on file    Forced sexual activity: Not on file  Other Topics Concern  . Not on file  Social History Narrative   Drinks 1 cup of coffee and 12 oz ginger ale.      ROS:  General: Negative for anorexia, weight loss, fever, chills, fatigue, weakness. Eyes: Negative for vision changes.  ENT: Negative for hoarseness, difficulty swallowing , nasal congestion. CV: Negative for chest pain, angina, palpitations, dyspnea on exertion, peripheral edema.  Respiratory: Negative for dyspnea at rest, dyspnea on exertion, cough, sputum, wheezing.  GI: See history of present illness. GU:  Negative for dysuria, hematuria, urinary incontinence, urinary frequency, nocturnal urination.  MS: Negative for joint pain, low back pain.  Derm: Negative for rash or itching.  Neuro: Negative for weakness, abnormal sensation, seizure, frequent headaches, memory loss, confusion.  Psych: Negative for anxiety, depression, suicidal ideation, hallucinations.  Endo: Negative for unusual weight change.  Heme: Negative for bruising or bleeding. Allergy:  Negative for rash or hives.    Physical Examination:  BP 124/85   Pulse 75   Temp 97.9 F (36.6 C) (Oral)   Ht 5\' 2"  (1.575 m)   Wt 227 lb 3.2 oz (103.1 kg)   LMP 12/05/2013   BMI 41.56 kg/m    General: Well-nourished, well-developed in no acute distress.  Head: Normocephalic, atraumatic.   Eyes: Conjunctiva pink, no icterus. Mouth: Oropharyngeal mucosa moist and pink , no lesions erythema or exudate. Neck: Supple without thyromegaly, masses, or lymphadenopathy.  Lungs: Clear to auscultation bilaterally.  Heart: Regular rate and rhythm, no murmurs rubs or gallops.  Abdomen: Bowel sounds are normal, nontender, nondistended, no hepatosplenomegaly or masses, no abdominal bruits or    hernia ,  no rebound or guarding.   Rectal: Not performed. Extremities: No lower extremity edema. No clubbing or deformities.  Neuro: Alert and oriented x 4 , grossly normal neurologically.  Skin: Warm and dry, no rash or jaundice.   Psych: Alert and cooperative, normal mood and affect.

## 2018-11-25 NOTE — Progress Notes (Signed)
cc'ed to pcp °

## 2018-11-25 NOTE — Assessment & Plan Note (Signed)
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  Doing well with regards to her chronic diarrhea as long she takes Colestid every morning.  No GI complaints.  Plan for colonoscopy in the near future.  I have discussed the risks, alternatives, benefits with regards to but not limited to the risk of reaction to medication, bleeding, infection, perforation and the patient is agreeable to proceed. Written consent to be obtained.

## 2018-11-25 NOTE — Telephone Encounter (Signed)
Patient called back to state she has suprep at home and it does not expire until end of march. Procedure scheduled for 02/06/19 at 10:30am. Confirmed mailing address and I have mailed instructions. Orders entered.

## 2018-11-25 NOTE — Assessment & Plan Note (Signed)
Doing very well with over-the-counter Nexium as needed.  Continue antireflux measures.

## 2018-11-25 NOTE — Patient Instructions (Signed)
Continue Nexium 20mg  daily as needed to manage your acid reflux.  Continue Colestid one daily, do not take within two hours of your other medications.   Colonoscopy as scheduled. See separate instructions.   GREAT SEEING YOU TODAY! HAVE A HAPPY NEW YEAR!

## 2018-12-02 ENCOUNTER — Telehealth: Payer: Self-pay | Admitting: Gastroenterology

## 2018-12-02 NOTE — Telephone Encounter (Signed)
Pt can't get off work to have her procedure with SF on 3/13 and needs to reschedule. [phone]

## 2018-12-02 NOTE — Telephone Encounter (Signed)
Called pt, TCS rescheduled to 02/16/19 at 10:30am. New instructions mailed. LMOVM for endo scheduler.

## 2018-12-10 ENCOUNTER — Other Ambulatory Visit: Payer: Self-pay | Admitting: Physician Assistant

## 2019-02-09 ENCOUNTER — Telehealth: Payer: Self-pay | Admitting: Gastroenterology

## 2019-02-09 NOTE — Telephone Encounter (Signed)
Patient wants to reschedule her procedure   Please call [phone]

## 2019-02-09 NOTE — Telephone Encounter (Signed)
CM advised pt would need OV to reschedule d/t last OV was 11/25/18 and she has previously rescheduled. Called and informed pt, she will think about it and call office back because she is driving.

## 2019-02-10 ENCOUNTER — Other Ambulatory Visit: Payer: Self-pay

## 2019-02-10 ENCOUNTER — Telehealth: Payer: Self-pay | Admitting: Gastroenterology

## 2019-02-10 MED ORDER — NA SULFATE-K SULFATE-MG SULF 17.5-3.13-1.6 GM/177ML PO SOLN: 1.0000 | ORAL | 0 refills | Status: DC

## 2019-02-10 NOTE — Telephone Encounter (Signed)
Pt called office, CM spoke to pt.  TCS w/SLF rescheduled to 05/18/19 at 9:30am. She requested new rx for prep be sent to pharmacy d/t the prep she has now has expired. Rx sent to pharmacy. New instructions mailed. Endo scheduler informed.

## 2019-02-10 NOTE — Telephone Encounter (Signed)
Routing to CM 

## 2019-02-10 NOTE — Telephone Encounter (Signed)
I tried to call the patient.  I was unable to reach her, [name].  Given the recent changes with elective procedures, please reschedule the patient, no need for the patient to return to the office prior to rescheduling.

## 2019-02-10 NOTE — Telephone Encounter (Signed)
See other phone note for today. 

## 2019-02-10 NOTE — Telephone Encounter (Signed)
[place] PATIENT TO RESCHEDULED HER PROCEDURE

## 2019-05-08 ENCOUNTER — Telehealth: Payer: Self-pay | Admitting: *Deleted

## 2019-05-08 NOTE — Telephone Encounter (Signed)
Called pt to schedule COVID 19 screening.  Pt said that she can not be in quarantine on 05/14/2019.  Her son was in a bad accident and she has to take him to his appointment on that day.  She said that she also could not afford to be out of work financially because of all that's going on.  Pt was informed that the hospital is not doing rapid test right now (per discussion with Hoyle Sauer this week).  I informed her that I would follow up with Hoyle Sauer on Monday to see if anything had changed.  Pt informed that I would be reaching out to her once I had an answer.

## 2019-05-11 ENCOUNTER — Encounter: Payer: Self-pay | Admitting: *Deleted

## 2019-05-11 NOTE — Telephone Encounter (Addendum)
[name] spoke with [name] at [facility] this morning and was told that we still need to refrain from scheduling rapid test right now due to a shortage of them.  Called pt back to inform her.  Pt requested to re-schedule her procedure.  Her new date is 07/10/2019.  Pt is aware that we will mail her out new prep instructions.  Endo notified.

## 2019-05-28 ENCOUNTER — Telehealth: Payer: Self-pay

## 2019-05-28 NOTE — Telephone Encounter (Signed)
Called pt, COVID test scheduled for 07/08/19 at 3:25pm. Pt is aware to quarantine at home after test until procedure 07/10/19.

## 2019-07-08 ENCOUNTER — Other Ambulatory Visit: Payer: Self-pay

## 2019-07-08 ENCOUNTER — Other Ambulatory Visit (HOSPITAL_COMMUNITY)
Admission: RE | Admit: 2019-07-08 | Discharge: 2019-07-08 | Disposition: A | Discharge status: Home / Self Care | Payer: BC Managed Care – PPO | Source: Ambulatory Visit | Attending: Gastroenterology | Admitting: Gastroenterology

## 2019-07-08 LAB — SARS CORONAVIRUS 2 (TAT 6-24 HRS): SARS Coronavirus 2: NEGATIVE

## 2019-07-10 ENCOUNTER — Encounter (HOSPITAL_COMMUNITY)
Admission: RE | Disposition: A | Discharge status: Home / Self Care | Payer: Self-pay | Source: Home / Self Care | Attending: Gastroenterology

## 2019-07-10 ENCOUNTER — Encounter (HOSPITAL_COMMUNITY): Payer: Self-pay | Admitting: *Deleted

## 2019-07-10 ENCOUNTER — Ambulatory Visit (HOSPITAL_COMMUNITY)
Admission: RE | Admit: 2019-07-10 | Discharge: 2019-07-10 | Disposition: A | Discharge status: Home / Self Care | Payer: BC Managed Care – PPO | Attending: Gastroenterology | Admitting: Gastroenterology

## 2019-07-10 ENCOUNTER — Other Ambulatory Visit: Payer: Self-pay

## 2019-07-10 DIAGNOSIS — K635 Polyp of colon: Secondary | ICD-10-CM

## 2019-07-10 DIAGNOSIS — G4730 Sleep apnea, unspecified: Secondary | ICD-10-CM | POA: Diagnosis not present

## 2019-07-10 DIAGNOSIS — K5730 Diverticulosis of large intestine without perforation or abscess without bleeding: Secondary | ICD-10-CM | POA: Diagnosis not present

## 2019-07-10 DIAGNOSIS — D124 Benign neoplasm of descending colon: Secondary | ICD-10-CM | POA: Insufficient documentation

## 2019-07-10 DIAGNOSIS — K644 Residual hemorrhoidal skin tags: Secondary | ICD-10-CM | POA: Insufficient documentation

## 2019-07-10 DIAGNOSIS — Z20828 Contact with and (suspected) exposure to other viral communicable diseases: Secondary | ICD-10-CM | POA: Insufficient documentation

## 2019-07-10 DIAGNOSIS — Z6839 Body mass index (BMI) 39.0-39.9, adult: Secondary | ICD-10-CM | POA: Insufficient documentation

## 2019-07-10 DIAGNOSIS — F419 Anxiety disorder, unspecified: Secondary | ICD-10-CM | POA: Insufficient documentation

## 2019-07-10 DIAGNOSIS — Z8249 Family history of ischemic heart disease and other diseases of the circulatory system: Secondary | ICD-10-CM | POA: Insufficient documentation

## 2019-07-10 DIAGNOSIS — K648 Other hemorrhoids: Secondary | ICD-10-CM | POA: Insufficient documentation

## 2019-07-10 DIAGNOSIS — Z1211 Encounter for screening for malignant neoplasm of colon: Secondary | ICD-10-CM | POA: Insufficient documentation

## 2019-07-10 DIAGNOSIS — Z79899 Other long term (current) drug therapy: Secondary | ICD-10-CM | POA: Insufficient documentation

## 2019-07-10 DIAGNOSIS — I10 Essential (primary) hypertension: Secondary | ICD-10-CM | POA: Diagnosis not present

## 2019-07-10 DIAGNOSIS — D123 Benign neoplasm of transverse colon: Secondary | ICD-10-CM | POA: Diagnosis not present

## 2019-07-10 DIAGNOSIS — K219 Gastro-esophageal reflux disease without esophagitis: Secondary | ICD-10-CM | POA: Insufficient documentation

## 2019-07-10 DIAGNOSIS — K589 Irritable bowel syndrome without diarrhea: Secondary | ICD-10-CM | POA: Insufficient documentation

## 2019-07-10 DIAGNOSIS — E669 Obesity, unspecified: Secondary | ICD-10-CM | POA: Insufficient documentation

## 2019-07-10 HISTORY — PX: BIOPSY: SHX5522

## 2019-07-10 HISTORY — DX: Other specified postprocedural states: R11.2

## 2019-07-10 HISTORY — PX: POLYPECTOMY: SHX5525

## 2019-07-10 HISTORY — DX: Other specified postprocedural states: Z98.890

## 2019-07-10 HISTORY — PX: COLONOSCOPY: SHX5424

## 2019-07-10 SURGERY — COLONOSCOPY
Anesthesia: Moderate Sedation

## 2019-07-10 MED ORDER — SODIUM CHLORIDE 0.9 % IV SOLN
INTRAVENOUS | Status: DC
  Administered 2019-07-10: 1000 mL via INTRAVENOUS

## 2019-07-10 MED ORDER — MEPERIDINE HCL 100 MG/ML IJ SOLN
INTRAMUSCULAR | Status: DC
Start: 2019-07-10 — End: 2019-07-10
  Filled 2019-07-10: qty 2

## 2019-07-10 MED ORDER — MEPERIDINE HCL 100 MG/ML IJ SOLN
INTRAMUSCULAR | Status: DC | PRN
  Administered 2019-07-10: 25 mg
  Administered 2019-07-10: 50 mg
  Administered 2019-07-10: 25 mg

## 2019-07-10 MED ORDER — MIDAZOLAM HCL 5 MG/5ML IJ SOLN
INTRAMUSCULAR | Status: AC
  Filled 2019-07-10: qty 10

## 2019-07-10 MED ORDER — MIDAZOLAM HCL 5 MG/5ML IJ SOLN
INTRAMUSCULAR | Status: DC | PRN
  Administered 2019-07-10 (×2): 2 mg via INTRAVENOUS
  Administered 2019-07-10: 1 mg via INTRAVENOUS

## 2019-07-10 MED ORDER — STERILE WATER FOR IRRIGATION IR SOLN
Status: DC | PRN
  Administered 2019-07-10: 5 mL

## 2019-07-10 NOTE — Op Note (Signed)
[facility] Patient Name: [name] Procedure Date: 07/10/2019 9:22 AM MRN: [id number] Date of Birth: 04/13/1966 Attending MD: Barney Drain MD, MD CSN: [id number] Age: 53 Admit Type: Outpatient Procedure:                Colonoscopy WITH COLD FORCEPS/COLD SNARE POLYPECTOMY Indications:              Screening for colorectal malignant neoplasm Providers:                Barney Drain MD, MD, Janeece Riggers, RN, Bonnetta Barry,                            Technician Referring MD:             Halford Chessman MD, MD Medicines:                Meperidine 100 mg IV, Midazolam 5 mg IV Complications:            No immediate complications. Estimated Blood Loss:     Estimated blood loss was minimal. Procedure:                Pre-Anesthesia Assessment:                           - Prior to the procedure, a History and Physical                            was performed, and patient medications and                            allergies were reviewed. The patient's tolerance of                            previous anesthesia was also reviewed. The risks                            and benefits of the procedure and the sedation                            options and risks were discussed with the patient.                            All questions were answered, and informed consent                            was obtained. Prior Anticoagulants: The patient has                            taken no previous anticoagulant or antiplatelet                            agents. ASA Grade Assessment: II - A patient with                            mild systemic disease. After reviewing the risks  and benefits, the patient was deemed in                            satisfactory condition to undergo the procedure.                            After obtaining informed consent, the colonoscope                            was passed under direct vision. Throughout the                            procedure,  the patient's blood pressure, pulse, and                            oxygen saturations were monitored continuously. The                            [id number]-[id number] ([id number]) scope was introduced through                            the anus and advanced to the the cecum, identified                            by appendiceal orifice and ileocecal valve. The                            colonoscopy was somewhat difficult due to a                            tortuous colon. Successful completion of the                            procedure was aided by straightening and shortening                            the scope to obtain bowel loop reduction and                            COLOWRAP. The patient tolerated the procedure well.                            The quality of the bowel preparation was excellent.                            The ileocecal valve, appendiceal orifice, and                            rectum were photographed. Scope In: 9:50:15 AM Scope Out: 10:10:50 AM Scope Withdrawal Time: 0 hours 18 minutes 42 seconds  Total Procedure Duration: 0 hours 20 minutes 35 seconds  Findings:      Four sessile polyps were found in the descending colon and hepatic       flexure. The polyps  were 3 to 5 mm in size. These polyps were removed       with a cold snare. Resection and retrieval were complete.      A small polypoid lesion was found at the hepatic flexure. The lesion was       submucosal. No bleeding was present. Biopsies were taken with a cold       forceps for histology.      Multiple small and large-mouthed diverticula were found in the       recto-sigmoid colon, sigmoid colon and descending colon.      External and internal hemorrhoids were found. Impression:               - Four 3 to 5 mm polyps in the descending colon(2)                            and at the hepatic flexure(2), removed with a cold                            snare. Resected and retrieved.                           - Likely  benign polypoid lesion at the hepatic                            flexure. Biopsied.                           - MODERATE Diverticulosis in the recto-sigmoid                            colon, in the sigmoid colon and in the descending                            colon.                           - External and internal hemorrhoids. Moderate Sedation:      Moderate (conscious) sedation was administered by the endoscopy nurse       and supervised by the endoscopist. The following parameters were       monitored: oxygen saturation, heart rate, blood pressure, and response       to care. Total physician intraservice time was 34 minutes. Recommendation:           - Patient has a contact number available for                            emergencies. The signs and symptoms of potential                            delayed complications were discussed with the                            patient. Return to normal activities tomorrow.                            Written  discharge instructions were provided to the                            patient.                           - High fiber diet.                           - Continue present medications.                           - Await pathology results.                           - Repeat colonoscopy in 3 years for surveillance. Procedure Code(s):        --- Professional ---                           [phone], Colonoscopy, flexible; with removal of                            tumor(s), polyp(s), or other lesion(s) by snare                            technique                           45380, 59, Colonoscopy, flexible; with biopsy,                            single or multiple                           99153, Moderate sedation; each additional 15                            minutes intraservice time                           G0500, Moderate sedation services provided by the                            same physician or other qualified health care                             professional performing a gastrointestinal                            endoscopic service that sedation supports,                            requiring the presence of an independent trained                            observer to assist in the monitoring of the  patient's level of consciousness and physiological                            status; initial 15 minutes of intra-service time;                            patient age 65 years or older (additional time may                            be reported with [id number], as appropriate) Diagnosis Code(s):        --- Professional ---                           Z12.11, Encounter for screening for malignant                            neoplasm of colon                           K63.5, Polyp of colon                           D49.0, Neoplasm of unspecified behavior of                            digestive system                           K64.8, Other hemorrhoids                           K57.30, Diverticulosis of large intestine without                            perforation or abscess without bleeding CPT copyright 2019 American Medical Association. All rights reserved. The codes documented in this report are preliminary and upon coder review may  be revised to meet current compliance requirements. Barney Drain, MD Barney Drain MD, MD 07/10/2019 10:32:33 AM This report has been signed electronically. Number of Addenda: 0

## 2019-07-10 NOTE — H&P (Signed)
Primary Care Physician:  Sharilyn Sites, MD Primary Gastroenterologist:  Dr. Oneida Alar  Pre-Procedure History & Physical: HPI:  [name] is a 53 y.o. female here for [place].  Past Medical History:  Diagnosis Date  . Anxiety   . Cervical atypia, mild 12/2013   Sonohysterogram biopsy showed benign endometrium but inflamed mild atypia and squamous mucosa fragments recommend repeat Pap smear 6 months  . GERD (gastroesophageal reflux disease)   . HTN (hypertension)   . Irritable bowel syndrome   . Obesity   . PONV (postoperative nausea and vomiting)   . Sleep apnea    C PAP  . Snoring   . URI (upper respiratory infection)     Past Surgical History:  Procedure Laterality Date  . 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 CESAREAN SECTION  10/2003  . CHOLECYSTECTOMY  2006  . COLONOSCOPY  2009   Dr. Oneida Alar: frequent sigmoid colon diverticula, small internal hemrorhoids, colonic biopsies to assess for microscopic colitis, Benign   . GYNECOLOGIC CRYOSURGERY    . MAXILLARY ANTROSTOMY Left 05/14/2016   Procedure: LEFT ENDOSCOPIC MAXILLARY ANTROSTOMY WITH FUSION NAVIGATION;  Surgeon: Leta Baptist, MD;  Location: [place];  Service: ENT;  Laterality: Left;  LEFT ENDOSCOPIC MAXILLARY ANTROSTOMY WITH FUSION NAVIGATION  . NASAL SEPTOPLASTY W/ TURBINOPLASTY Bilateral 05/14/2016   Procedure: NASAL SEPTOPLASTY WITH TURBINATE REDUCTION;  Surgeon: Leta Baptist, MD;  Location: [place];  Service: ENT;  Laterality: Bilateral;  NASAL SEPTOPLASTY WITH TURBINATE REDUCTION  . SINUS ENDO WITH FUSION Right 05/14/2016   Procedure: RIGHT ENDOSCOPIC  ETHMOIDECTOMY AND RIGHT ENDOSCOPIC FRONTAL SINUS EXPLORATION;  Surgeon: Leta Baptist, MD;  Location: [place];  Service: ENT;  Laterality: Right;  RIGHT ENDOSCOPIC  ETHMOIDECTOMY AND RIGHT ENDOSCOPIC FRONTAL SINUS EXPLORATION    Prior to Admission medications   Medication Sig Start Date End Date  Taking? Authorizing Provider  acetaminophen (TYLENOL) 500 MG tablet Take 1,000 mg by mouth every 6 (six) hours as needed (for pain.).   Yes [provider]  colestipol (COLESTID) 1 g tablet TAKE 1 TABLET(1 GRAM) BY MOUTH DAILY Patient taking differently: Take 1 g by mouth daily.  10/22/18  Yes Carlis Stable, NP  esomeprazole (NEXIUM) 20 MG capsule Take 20 mg by mouth daily at 2 PM. In the afternoon.   Yes [provider]  losartan (COZAAR) 100 MG tablet Take 100 mg by mouth daily.  04/19/16  Yes [provider]  Na Sulfate-K Sulfate-Mg Sulf (SUPREP BOWEL PREP KIT) 17.5-3.13-1.6 GM/177ML SOLN Take 1 kit by mouth as directed. 02/10/19  Yes Lexi Conaty L, MD  sertraline (ZOLOFT) 50 MG tablet Take 50 mg by mouth daily.  02/24/18  Yes [provider]    Allergies as of 11/25/2018 - Review Complete 11/25/2018  Allergen Reaction Noted  . Penicillins Shortness Of Breath     Family History  Problem Relation Age of Onset  . Hypertension Father   . Heart attack Father   . Hypertension Sister   . Breast cancer Sister 34  . Hypertension Mother   . Cervical cancer Sister   . Colon polyps Neg Hx   . Colon cancer Neg Hx     Social History   Socioeconomic History  . Marital status: Married    Spouse name: Not on file  . Number of children: Not on file  . Years of education: Not on file  . Highest education level:  Not on file  Occupational History  . Occupation: Financial planner: [employer]  . Financial resource strain: Not on file  . Food insecurity    Worry: Not on file    Inability: Not on file  . Transportation needs    Medical: Not on file    Non-medical: Not on file  Tobacco Use  . 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 Smokeless tobacco: Never Used  Substance and Sexual Activity  . Alcohol use: Yes    Alcohol/week: 0.0 standard drinks    Comment: socially  . Drug use: No  . Sexual activity: Yes    Birth  control/protection: Other-see comments    Comment: vasectomy-1st intercourse 31 yo-5 partners  Lifestyle  . Physical activity    Days per week: Not on file    Minutes per session: Not on file  . Stress: Not on file  Relationships  . Social Herbalist on phone: Not on file    Gets together: Not on file    Attends religious service: Not on file    Active member of club or organization: Not on file    Attends meetings of clubs or organizations: Not on file    Relationship status: Not on file  . Intimate partner violence    Fear of current or ex partner: Not on file    Emotionally abused: Not on file    Physically abused: Not on file    Forced sexual activity: Not on file  Other Topics Concern  . Not on file  Social History Narrative   Drinks 1 cup of coffee and 12 oz ginger ale.    Review of Systems: See HPI, otherwise negative ROS   Physical Exam: BP (!) 136/107   Pulse 82   Temp 98 F (36.7 C) (Oral)   Resp 17   Ht '5\' 2"'  (1.575 m)   Wt 97.5 kg   LMP 12/05/2013   SpO2 96%   BMI 39.32 kg/m  General:   Alert,  pleasant and cooperative in NAD Head:  Normocephalic and atraumatic. Neck:  Supple; Lungs:  Clear throughout to auscultation.    Heart:  Regular rate and rhythm. Abdomen:  Soft, nontender and nondistended. Normal bowel sounds, without guarding, and without rebound.   Neurologic:  Alert and  oriented x4;  grossly normal neurologically.  Impression/Plan:    SCREENING  Plan:  1. TCS TODAY DISCUSSED PROCEDURE, BENEFITS, & RISKS: < 1% chance of medication reaction, bleeding, perforation, ASPIRATION, or rupture of spleen/liver requiring surgery to fix it and missed polyps < 1 cm 10-20% of the time.

## 2019-07-10 NOTE — Discharge Instructions (Signed)
You have MODERATE internal hemorrhoids and diverticulosis IN YOUR LEFT COLON. YOU HAD FOUR POLYPS REMOVED. YOU HAVE TWO LIPOMAS IN YOUR COLON. THESE WILL GO AWAY WHEN YOU LOSE WEIGHT.   DRINK WATER TO KEEP YOUR URINE LIGHT YELLOW.  EAT TO LIVE AND THINK OF FOOD AS MEDICINE. WELCOME TO WELLNESS.   To have more energy and to lose weight:      1. READ AND FOLLOW RECOMMENDATIONS BY DR. MARK HYMAN, "10-DAY DETOX DIET". YOUR BODY MASS INDEX IS OVER 30 WHICH MEANS YOU ARE OBESE. OBESITY IS ASSOCIATED WITH AN INCREASED FOR POLYPS, CIRRHOSIS, AND ALL CANCERS, INCLUDING ESOPHAGEAL AND COLON CANCER.      2. If you must eat bread, EAT EZEKIEL BREAD. IT IS IN THE FROZEN SECTION OF THE GROCERY STORE.    3. Do not drink SODA, GATORADE, ENERGY DRINKS, OR DIET SODA.     4. AVOID HIGH FRUCTOSE CORN SYRUP.     5. DO NOT chew SUGAR FREE GUM OR USE ARTIFICIAL SWEETENERS. USE STEVIA AS A SWEETENER.    6. DO NOT EAT ENRICHED WHEAT FLOUR, PASTA, RICE, OR CEREAL.    7. DO NOT EAT "FARM RAISED" MEAT OR FISH. ONLY EAT WILD CAUGHT SEAFOOD, GRASS FED BEEF OR CHICKEN, OR EGGS FROM PASTURE RAISED CHICKENS.    8. PRACTICE CHAIR YOGA FOR 15-30 MINS 3 OR 4 TIMES A WEEK AND PROGRESS TO HATHA YOGA OVER NEXT 6 MOS.    9. TAKE MULTIVITAMIN, VITAMIN B12, AND VITAMIN D3 2000 IU DAILY.    USE PREPARATION H FOUR TIMES  A DAY IF NEEDED TO RELIEVE RECTAL PAIN/PRESSURE/BLEEDING.   YOUR BIOPSY RESULTS WILL BE BACK IN 5 BUSINESS DAYS.  Next colonoscopy in 3 YEARS.  Colonoscopy Care After Read the instructions outlined below and refer to this sheet in the next week. These discharge instructions provide you with general information on caring for yourself after you leave the hospital. While your treatment has been planned according to the most current medical practices available, unavoidable complications occasionally occur. If you have any problems or questions after discharge, call DR. Ritik Stavola,  [phone].  ACTIVITY  You may resume your regular activity, but move at a slower pace for the next 24 hours.   Take frequent rest periods for the next 24 hours.   Walking will help get rid of the air and reduce the bloated feeling in your belly (abdomen).   No driving for 24 hours (because of the medicine (anesthesia) used during the test).   You may shower.   Do not sign any important legal documents or operate any machinery for 24 hours (because of the anesthesia used during the test).    NUTRITION  Drink plenty of fluids.   You may resume your normal diet as instructed by your doctor.   Begin with a light meal and progress to your normal diet. Heavy or fried foods are harder to digest and may make you feel sick to your stomach (nauseated).   Avoid alcoholic beverages for 24 hours or as instructed.    MEDICATIONS  You may resume your normal medications.   WHAT YOU CAN EXPECT TODAY  Some feelings of bloating in the abdomen.   Passage of more gas than usual.   Spotting of blood in your stool or on the toilet paper  .  IF YOU HAD POLYPS REMOVED DURING THE COLONOSCOPY:  Eat a soft diet IF YOU HAVE NAUSEA, BLOATING, ABDOMINAL PAIN, OR VOMITING.    FINDING OUT THE RESULTS OF  YOUR TEST Not all test results are available during your visit. DR. Oneida Alar WILL CALL YOU WITHIN 14 DAYS OF YOUR PROCEDUE WITH YOUR RESULTS. Do not assume everything is normal if you have not heard from DR. Yamilex Borgwardt, CALL HER OFFICE AT [phone].  SEEK IMMEDIATE MEDICAL ATTENTION AND CALL THE OFFICE: [phone] IF:  You have more than a spotting of blood in your stool.   Your belly is swollen (abdominal distention).   You are nauseated or vomiting.   You have a temperature over 101F.   You have abdominal pain or discomfort that is severe or gets worse throughout the day.  High-Fiber Diet A high-fiber diet changes your normal diet to include more whole grains, legumes, fruits, and  vegetables. Changes in the diet involve replacing refined carbohydrates with unrefined foods. The calorie level of the diet is essentially unchanged. The Dietary Reference Intake (recommended amount) for adult males is 38 grams per day. For adult females, it is 25 grams per day. Pregnant and lactating women should consume 28 grams of fiber per day. Fiber is the intact part of a plant that is not broken down during digestion. Functional fiber is fiber that has been isolated from the plant to provide a beneficial effect in the body.  PURPOSE  Increase stool bulk.   Ease and regulate bowel movements.   Lower cholesterol.   REDUCE RISK OF COLON CANCER  INDICATIONS THAT YOU NEED MORE FIBER  Constipation and hemorrhoids.   Uncomplicated diverticulosis (intestine condition) and irritable bowel syndrome.   Weight management.   As a protective measure against hardening of the arteries (atherosclerosis), diabetes, and cancer.   GUIDELINES FOR INCREASING FIBER IN THE DIET  Start adding fiber to the diet slowly. A gradual increase of about 5 more grams (2 servings of most fruits or vegetables) per day is best. Too rapid an increase in fiber may result in constipation, flatulence, and bloating.   Drink enough water and fluids to keep your urine clear or pale yellow. Water, juice, or caffeine-free drinks are recommended. Not drinking enough fluid may cause constipation.   Eat a variety of high-fiber foods rather than one type of fiber.   Try to increase your intake of fiber through using high-fiber foods rather than fiber pills or supplements that contain small amounts of fiber.   The goal is to change the types of food eaten. Do not supplement your present diet with high-fiber foods, but replace foods in your present diet.    Polyps, Colon  A polyp is extra tissue that grows inside your body. Colon polyps grow in the large intestine. The large intestine, also called the colon, is part of your  digestive system. It is a long, hollow tube at the end of your digestive tract where your body makes and stores stool. Most polyps are not dangerous. They are benign. This means they are not cancerous. But over time, some types of polyps can turn into cancer. Polyps that are smaller than a pea are usually not harmful. But larger polyps could someday become or may already be cancerous. To be safe, doctors remove all polyps and test them.   PREVENTION There is not one sure way to prevent polyps. You might be able to lower your risk of getting them if you:  Eat more fruits and vegetables and less fatty food.   Do not smoke.   Avoid alcohol.   Exercise every day.   Lose weight if you are overweight.   Eating more  calcium and folate can also lower your risk of getting polyps. Some foods that are rich in calcium are milk, cheese, and broccoli. Some foods that are rich in folate are chickpeas, kidney beans, and spinach.    Diverticulosis Diverticulosis is a common condition that develops when small pouches (diverticula) form in the wall of the colon. The risk of diverticulosis increases with age. It happens more often in people who eat a low-fiber diet. Most individuals with diverticulosis have no symptoms. Those individuals with symptoms usually experience belly (abdominal) pain, constipation, or loose stools (diarrhea).  HOME CARE INSTRUCTIONS  Increase the amount of fiber in your diet as directed by your caregiver or dietician. This may reduce symptoms of diverticulosis.   Drink at least 6 to 8 glasses of water each day to prevent constipation.   Try not to strain when you have a bowel movement.   Avoiding nuts and seeds to prevent complications is NOT NECESSARY.   FOODS HAVING HIGH FIBER CONTENT INCLUDE:  Fruits. Apple, peach, pear, tangerine, raisins, prunes.   Vegetables. Brussels sprouts, asparagus, broccoli, cabbage, carrot, cauliflower, romaine lettuce, spinach, summer squash,  tomato, winter squash, zucchini.   Starchy Vegetables. Baked beans, kidney beans, lima beans, split peas, lentils, potatoes (with skin).   SEEK IMMEDIATE MEDICAL CARE IF:  You develop increasing pain or severe bloating.   You have an oral temperature above 101F.   You develop vomiting or bowel movements that are bloody or black.

## 2019-07-14 NOTE — Progress Notes (Signed)
CC'D TO PCP °

## 2019-07-16 ENCOUNTER — Telehealth: Payer: Self-pay | Admitting: Gastroenterology

## 2019-07-16 NOTE — Telephone Encounter (Signed)
PLEASE CALL PT. SHE HAD FOUR SIMPLE ADENOMAS REMOVED.   DRINK WATER TO KEEP YOUR URINE LIGHT YELLOW. CONTINUE YOUR WEIGHT LOSS EFFORTS. FOLLOW A HIGH FIBER DIET. AVOID ITEMS THAT CAUSE BLOATING & GAS.  Next colonoscopy in 3 years.

## 2019-07-17 ENCOUNTER — Encounter (HOSPITAL_COMMUNITY): Payer: Self-pay | Admitting: Gastroenterology

## 2019-07-17 NOTE — Telephone Encounter (Signed)
PT is aware.

## 2019-07-20 NOTE — Telephone Encounter (Signed)
REMINDER IN Epic, CC'ED TO PCP

## 2019-08-10 ENCOUNTER — Other Ambulatory Visit: Payer: Self-pay | Admitting: Nurse Practitioner

## 2019-08-19 ENCOUNTER — Encounter: Payer: Self-pay | Admitting: Gynecology

## 2019-08-21 ENCOUNTER — Other Ambulatory Visit: Payer: Self-pay | Admitting: Gynecology

## 2019-08-21 DIAGNOSIS — Z1231 Encounter for screening mammogram for malignant neoplasm of breast: Secondary | ICD-10-CM

## 2019-10-02 ENCOUNTER — Other Ambulatory Visit: Payer: Self-pay

## 2019-10-02 ENCOUNTER — Ambulatory Visit
Admission: RE | Admit: 2019-10-02 | Discharge: 2019-10-02 | Disposition: A | Discharge status: Home / Self Care | Payer: BC Managed Care – PPO | Source: Ambulatory Visit | Attending: Gynecology | Admitting: Gynecology

## 2019-10-02 DIAGNOSIS — Z1231 Encounter for screening mammogram for malignant neoplasm of breast: Secondary | ICD-10-CM

## 2019-11-12 ENCOUNTER — Other Ambulatory Visit: Payer: Self-pay

## 2019-11-12 ENCOUNTER — Ambulatory Visit: Payer: BC Managed Care – PPO | Attending: Internal Medicine

## 2019-11-12 DIAGNOSIS — Z20822 Contact with and (suspected) exposure to covid-19: Secondary | ICD-10-CM

## 2019-11-14 LAB — NOVEL CORONAVIRUS, NAA: SARS-CoV-2, NAA: DETECTED — AB

## 2019-11-16 ENCOUNTER — Telehealth: Payer: Self-pay | Admitting: Infectious Diseases

## 2019-11-16 NOTE — Telephone Encounter (Signed)
Called to discuss with patient about Covid symptoms and the use of bamlanivimab, a monoclonal antibody infusion for those with mild to moderate Covid symptoms and at a high risk of hospitalization.  Pt is qualified for this infusion at the [facility] infusion center due to Age >55, Hypertension and BMI>35.    Symptoms started Monday 12/14. Fevers off and on throughout early last week with last one Wednesday evening.   She would like to think about this and discuss with her family. I will send her information via MyChart.   Last day for infusion would be Wednesday of this week which I informed her of.   Discussed RTW recommendations - will send info as well.

## 2019-11-17 ENCOUNTER — Other Ambulatory Visit: Payer: Self-pay | Admitting: Infectious Diseases

## 2019-11-17 DIAGNOSIS — U071 COVID-19: Secondary | ICD-10-CM

## 2019-11-17 DIAGNOSIS — I10 Essential (primary) hypertension: Secondary | ICD-10-CM

## 2019-11-17 DIAGNOSIS — Z6841 Body Mass Index (BMI) 40.0 and over, adult: Secondary | ICD-10-CM

## 2019-11-17 NOTE — Progress Notes (Signed)
  I connected by phone with [name] on 11/17/2019 at 1:08 PM to discuss the potential use of an new treatment for mild to moderate COVID-19 viral infection in non-hospitalized patients.  This patient is a 53 y.o. female that meets the FDA criteria for Emergency Use Authorization of bamlanivimab or casirivimab\imdevimab.  Has a (+) direct SARS-CoV-2 viral test result  Has mild or moderate COVID-19   Is ? 55 years of age and weighs ? 40 kg  Is NOT hospitalized due to COVID-19  Is NOT requiring oxygen therapy or requiring an increase in baseline oxygen flow rate due to COVID-19  Is within 10 days of symptom onset  Has at least one of the high risk factor(s) for progression to severe COVID-19 and/or hospitalization as defined in EUA.  Specific high risk criteria : Hypertension   I have spoken and communicated the following to the patient or parent/caregiver:  1. FDA has authorized the emergency use of bamlanivimab and casirivimab\imdevimab for the treatment of mild to moderate COVID-19 in adults and pediatric patients with positive results of direct SARS-CoV-2 viral testing who are 80 years of age and older weighing at least 40 kg, and who are at high risk for progressing to severe COVID-19 and/or hospitalization.  2. The significant known and potential risks and benefits of bamlanivimab and casirivimab\imdevimab, and the extent to which such potential risks and benefits are unknown.  3. Information on available alternative treatments and the risks and benefits of those alternatives, including clinical trials.  4. Patients treated with bamlanivimab and casirivimab\imdevimab should continue to self-isolate and use infection control measures (e.g., wear mask, isolate, social distance, avoid sharing personal items, clean and disinfect "high touch" surfaces, and frequent handwashing) according to CDC guidelines.   5. The patient or parent/caregiver has the option to accept or refuse  bamlanivimab or casirivimab\imdevimab .  After reviewing this information with the patient, The patient agreed to proceed with receiving the bamlanimivab infusion and will be provided a copy of the Fact sheet prior to receiving the infusion.[name] 11/17/2019 1:08 PM

## 2019-11-19 ENCOUNTER — Ambulatory Visit (HOSPITAL_COMMUNITY)
Admission: RE | Admit: 2019-11-19 | Discharge: 2019-11-19 | Disposition: A | Discharge status: Home / Self Care | Payer: BC Managed Care – PPO | Source: Ambulatory Visit | Attending: Pulmonary Disease | Admitting: Pulmonary Disease

## 2019-11-19 DIAGNOSIS — U071 COVID-19: Secondary | ICD-10-CM

## 2019-11-19 DIAGNOSIS — Z6841 Body Mass Index (BMI) 40.0 and over, adult: Secondary | ICD-10-CM | POA: Insufficient documentation

## 2019-11-19 DIAGNOSIS — I10 Essential (primary) hypertension: Secondary | ICD-10-CM | POA: Diagnosis present

## 2019-11-19 MED ORDER — DIPHENHYDRAMINE HCL 50 MG/ML IJ SOLN: 50.0000 mg | Freq: Once | INTRAMUSCULAR | Status: DC | PRN

## 2019-11-19 MED ORDER — ALBUTEROL SULFATE HFA 108 (90 BASE) MCG/ACT IN AERS: 2.0000 | INHALATION_SPRAY | Freq: Once | RESPIRATORY_TRACT | Status: DC | PRN

## 2019-11-19 MED ORDER — EPINEPHRINE 0.3 MG/0.3ML IJ SOAJ: 0.3000 mg | Freq: Once | INTRAMUSCULAR | Status: DC | PRN

## 2019-11-19 MED ORDER — SODIUM CHLORIDE 0.9 % IV SOLN
700.0000 mg | Freq: Once | INTRAVENOUS | Status: AC
  Administered 2019-11-19: 11:00:00 700 mg via INTRAVENOUS
  Filled 2019-11-19: qty 20

## 2019-11-19 MED ORDER — FAMOTIDINE IN NACL 20-0.9 MG/50ML-% IV SOLN: 20.0000 mg | Freq: Once | INTRAVENOUS | Status: DC | PRN

## 2019-11-19 MED ORDER — METHYLPREDNISOLONE SODIUM SUCC 125 MG IJ SOLR: 125.0000 mg | Freq: Once | INTRAMUSCULAR | Status: DC | PRN

## 2019-11-19 MED ORDER — SODIUM CHLORIDE 0.9 % IV SOLN
INTRAVENOUS | Status: DC | PRN
  Administered 2019-11-19: 11:00:00 250 mL via INTRAVENOUS

## 2019-11-19 NOTE — Progress Notes (Signed)
  Diagnosis: COVID-19  Physician: Sharilyn Sites, MD  Procedure: [facility] Med: bamlanivimab infusion - Provided patient with bamlanimivab fact sheet for patients, parents and caregivers prior to infusion.  Complications: No immediate complications noted.  Discharge: Discharged home   Nowthen 11/19/2019   ,

## 2019-11-19 NOTE — Progress Notes (Signed)
  Diagnosis: COVID-19  Physician: Dr. Joya Gaskins  Procedure: [facility] Med: bamlanivimab infusion - Provided patient with bamlanimivab fact sheet for patients, parents and caregivers prior to infusion.  Complications: No immediate complications noted.  Discharge: Discharged home   Janine Ores 11/19/2019

## 2019-11-19 NOTE — Discharge Instructions (Signed)
COVID-19 Frequently Asked Questions °COVID-19 (coronavirus disease) is an infection that is caused by a large family of viruses. Some viruses cause illness in people and others cause illness in animals like camels, cats, and bats. In some cases, the viruses that cause illness in animals can spread to humans. °Where did the coronavirus come from? °In December 2019, [place] told the World Health Organization (WHO) of several cases of lung disease (human respiratory illness). These cases were linked to an open seafood and livestock market in the city of [place]. The link to the seafood and livestock market suggests that the virus may have spread from animals to humans. However, since that first outbreak in December, the virus has also been shown to spread from person to person. °What is the name of the disease and the virus? °Disease name °Early on, this disease was called novel coronavirus. This is because scientists determined that the disease was caused by a new (novel) respiratory virus. The World Health Organization (WHO) has now named the disease COVID-19, or coronavirus disease. °Virus name °The virus that causes the disease is called severe acute respiratory syndrome coronavirus 2 (SARS-CoV-2). °More information on disease and virus naming °World Health Organization (WHO): www.who.int/emergencies/diseases/novel-coronavirus-2019/technical-guidance/naming-the-coronavirus-disease-(covid-2019)-and-the-virus-that-causes-it °Who is at risk for complications from coronavirus disease? °Some people may be at higher risk for complications from coronavirus disease. This includes older adults and people who have chronic diseases, such as heart disease, diabetes, and lung disease. °If you are at higher risk for complications, take these extra precautions: °· Avoid close contact with people who are sick or have a fever or cough. Stay at least 3-6 ft (1-2 m) away from them, if possible. °· Wash your hands often with soap and  water for at least 20 seconds. °· Avoid touching your face, mouth, nose, or eyes. °· Keep supplies on hand at home, such as food, medicine, and cleaning supplies. °· Stay home as much as possible. °· Avoid social gatherings and travel. °How does coronavirus disease spread? °The virus that causes coronavirus disease spreads easily from person to person (is contagious). There are also cases of community-spread disease. This means the disease has spread to: °· People who have no known contact with other infected people. °· People who have not traveled to areas where there are known cases. °It appears to spread from one person to another through droplets from coughing or sneezing. °Can I get the virus from touching surfaces or objects? °There is still a lot that we do not know about the virus that causes coronavirus disease. Scientists are basing a lot of information on what they know about similar viruses, such as: °· Viruses cannot generally survive on surfaces for long. They need a human body (host) to survive. °· It is more likely that the virus is spread by close contact with people who are sick (direct contact), such as through: °? Shaking hands or hugging. °? Breathing in respiratory droplets that travel through the air. This can happen when an infected person coughs or sneezes on or near other people. °· It is less likely that the virus is spread when a person touches a surface or object that has the virus on it (indirect contact). The virus may be able to enter the body if the person touches a surface or object and then touches his or her face, eyes, nose, or mouth. °Can a person spread the virus without having symptoms of the disease? °It may be possible for the virus to spread before a person   has symptoms of the disease, but this is most likely not the main way the virus is spreading. It is more likely for the virus to spread by being in close contact with people who are sick and breathing in the respiratory  droplets of a sick person's cough or sneeze. °What are the symptoms of coronavirus disease? °Symptoms vary from person to person and can range from mild to severe. Symptoms may include: °· Fever. °· Cough. °· Tiredness, weakness, or fatigue. °· Fast breathing or feeling short of breath. °These symptoms can appear anywhere from 2 to 14 days after you have been exposed to the virus. If you develop symptoms, call your health care provider. People with severe symptoms may need hospital care. °If I am exposed to the virus, how long does it take before symptoms start? °Symptoms of coronavirus disease may appear anywhere from 2 to 14 days after a person has been exposed to the virus. If you develop symptoms, call your health care provider. °Should I be tested for this virus? °Your health care provider will decide whether to test you based on your symptoms, history of exposure, and your risk factors. °How does a health care provider test for this virus? °Health care providers will collect samples to send for testing. Samples may include: °· Taking a swab of fluid from the nose. °· Taking fluid from the lungs by having you cough up mucus (sputum) into a sterile cup. °· Taking a blood sample. °· Taking a stool or urine sample. °Is there a treatment or vaccine for this virus? °Currently, there is no vaccine to prevent coronavirus disease. Also, there are no medicines like antibiotics or antivirals to treat the virus. A person who becomes sick is given supportive care, which means rest and fluids. A person may also relieve his or her symptoms by using over-the-counter medicines that treat sneezing, coughing, and runny nose. These are the same medicines that a person takes for the common cold. °If you develop symptoms, call your health care provider. People with severe symptoms may need hospital care. °What can I do to protect myself and my family from this virus? ° °  ° °You can protect yourself and your family by taking the  same actions that you would take to prevent the spread of other viruses. Take the following actions: °· Wash your hands often with soap and water for at least 20 seconds. If soap and water are not available, use alcohol-based hand sanitizer. °· Avoid touching your face, mouth, nose, or eyes. °· Cough or sneeze into a tissue, sleeve, or elbow. Do not cough or sneeze into your hand or the air. °? If you cough or sneeze into a tissue, throw it away immediately and wash your hands. °· Disinfect objects and surfaces that you frequently touch every day. °· Avoid close contact with people who are sick or have a fever or cough. Stay at least 3-6 ft (1-2 m) away from them, if possible. °· Stay home if you are sick, except to get medical care. Call your health care provider before you get medical care. °· Make sure your vaccines are up to date. Ask your health care provider what vaccines you need. °What should I do if I need to travel? °Follow travel recommendations from your local health authority, the CDC, and WHO. °Travel information and advice °· Centers for Disease Control and Prevention (CDC): www.cdc.gov/coronavirus/2019-ncov/travelers/index.html °· World Health Organization (WHO): www.who.int/emergencies/diseases/novel-coronavirus-2019/travel-advice °Know the risks and take action to protect your health °·   You are at higher risk of getting coronavirus disease if you are traveling to areas with an outbreak or if you are exposed to travelers from areas with an outbreak. °· Wash your hands often and practice good hygiene to lower the risk of catching or spreading the virus. °What should I do if I am sick? °General instructions to stop the spread of infection °· Wash your hands often with soap and water for at least 20 seconds. If soap and water are not available, use alcohol-based hand sanitizer. °· Cough or sneeze into a tissue, sleeve, or elbow. Do not cough or sneeze into your hand or the air. °· If you cough or  sneeze into a tissue, throw it away immediately and wash your hands. °· Stay home unless you must get medical care. Call your health care provider or local health authority before you get medical care. °· Avoid public areas. Do not take public transportation, if possible. °· If you can, wear a mask if you must go out of the house or if you are in close contact with someone who is not sick. °Keep your home clean °· Disinfect objects and surfaces that are frequently touched every day. This may include: °? Counters and tables. °? Doorknobs and light switches. °? Sinks and faucets. °? Electronics such as phones, remote controls, keyboards, computers, and tablets. °· Wash dishes in hot, soapy water or use a dishwasher. Air-dry your dishes. °· Wash laundry in hot water. °Prevent infecting other household members °· Let healthy household members care for children and pets, if possible. If you have to care for children or pets, wash your hands often and wear a mask. °· Sleep in a different bedroom or bed, if possible. °· Do not share personal items, such as razors, toothbrushes, deodorant, combs, brushes, towels, and washcloths. °Where to find more information °Centers for Disease Control and Prevention (CDC) °· Information and news updates: www.cdc.gov/coronavirus/2019-ncov °World Health Organization (WHO) °· Information and news updates: www.who.int/emergencies/diseases/novel-coronavirus-2019 °· Coronavirus health topic: www.who.int/health-topics/coronavirus °· Questions and answers on COVID-19: www.who.int/news-room/q-a-detail/q-a-coronaviruses °· Global tracker: who.sprinklr.com °American Academy of Pediatrics (AAP) °· Information for families: www.healthychildren.org/English/health-issues/conditions/chest-lungs/Pages/2019-Novel-Coronavirus.aspx °The coronavirus situation is changing rapidly. Check your local health authority website or the CDC and WHO websites for updates and news. °When should I contact a health care  provider? °· Contact your health care provider if you have symptoms of an infection, such as fever or cough, and you: °? Have been near anyone who is known to have coronavirus disease. °? Have come into contact with a person who is suspected to have coronavirus disease. °? Have traveled outside of the country. °When should I get emergency medical care? °· Get help right away by calling your local emergency services (911 in the [place] if you have: °? Trouble breathing. °? Pain or pressure in your chest. °? Confusion. °? Blue-tinged lips and fingernails. °? Difficulty waking from sleep. °? Symptoms that get worse. °Let the emergency medical personnel know if you think you have coronavirus disease. °Summary °· A new respiratory virus is spreading from person to person and causing COVID-19 (coronavirus disease). °· The virus that causes COVID-19 appears to spread easily. It spreads from one person to another through droplets from coughing or sneezing. °· Older adults and those with chronic diseases are at higher risk of disease. If you are at higher risk for complications, take extra precautions. °· There is currently no vaccine to prevent coronavirus disease. There are no medicines, such as antibiotics or   antivirals, to treat the virus. °· You can protect yourself and your family by washing your hands often, avoiding touching your face, and covering your coughs and sneezes. °This information is not intended to replace advice given to you by your health care provider. Make sure you discuss any questions you have with your health care provider. °Document Released: 03/10/2019 Document Revised: 03/10/2019 Document Reviewed: 03/10/2019 °Elsevier Patient Education © 2020 Else[employer] Inc. ° °

## 2020-01-31 ENCOUNTER — Ambulatory Visit: Payer: BC Managed Care – PPO

## 2020-03-07 ENCOUNTER — Other Ambulatory Visit: Payer: Self-pay | Admitting: Nurse Practitioner

## 2020-09-06 ENCOUNTER — Other Ambulatory Visit: Payer: Self-pay | Admitting: Obstetrics and Gynecology

## 2020-09-06 DIAGNOSIS — Z1231 Encounter for screening mammogram for malignant neoplasm of breast: Secondary | ICD-10-CM

## 2020-10-04 ENCOUNTER — Ambulatory Visit
Admission: RE | Admit: 2020-10-04 | Discharge: 2020-10-04 | Disposition: A | Discharge status: Home / Self Care | Payer: BC Managed Care – PPO | Source: Ambulatory Visit | Attending: Obstetrics and Gynecology | Admitting: Obstetrics and Gynecology

## 2020-10-04 ENCOUNTER — Other Ambulatory Visit: Payer: Self-pay

## 2020-10-04 DIAGNOSIS — Z1231 Encounter for screening mammogram for malignant neoplasm of breast: Secondary | ICD-10-CM

## 2020-10-19 ENCOUNTER — Encounter: Payer: Self-pay | Admitting: Internal Medicine

## 2020-10-24 ENCOUNTER — Ambulatory Visit: Payer: BC Managed Care – PPO | Admitting: Obstetrics and Gynecology

## 2021-05-22 ENCOUNTER — Other Ambulatory Visit: Payer: Self-pay | Admitting: Gastroenterology

## 2021-05-24 NOTE — Telephone Encounter (Signed)
Patient needs ov in the next 3-4 months to continue to get refills.

## 2021-05-25 ENCOUNTER — Encounter: Payer: Self-pay | Admitting: Internal Medicine

## 2021-06-06 ENCOUNTER — Encounter: Payer: Self-pay | Admitting: Adult Health

## 2021-06-06 ENCOUNTER — Other Ambulatory Visit (HOSPITAL_COMMUNITY)
Admission: RE | Admit: 2021-06-06 | Discharge: 2021-06-06 | Disposition: A | Discharge status: Home / Self Care | Payer: BC Managed Care – PPO | Source: Ambulatory Visit | Attending: Adult Health | Admitting: Adult Health

## 2021-06-06 ENCOUNTER — Ambulatory Visit (INDEPENDENT_AMBULATORY_CARE_PROVIDER_SITE_OTHER): Payer: BC Managed Care – PPO | Admitting: Adult Health

## 2021-06-06 ENCOUNTER — Other Ambulatory Visit: Payer: Self-pay

## 2021-06-06 VITALS — BP 138/92 | HR 80 | Ht 61.0 in | Wt 238.8 lb

## 2021-06-06 DIAGNOSIS — Z01419 Encounter for gynecological examination (general) (routine) without abnormal findings: Secondary | ICD-10-CM | POA: Insufficient documentation

## 2021-06-06 DIAGNOSIS — Z1211 Encounter for screening for malignant neoplasm of colon: Secondary | ICD-10-CM | POA: Insufficient documentation

## 2021-06-06 LAB — HEMOCCULT GUIAC POC 1CARD (OFFICE): Fecal Occult Blood, POC: NEGATIVE

## 2021-06-06 NOTE — Progress Notes (Signed)
Patient ID: [name], female   DOB: December 16, 1965, 55 y.o.   MRN: [id number] History of Present Illness: [name] is a 55 year old white female,married, G1P1, PM, in for a well woman gyn exam and pap. She is a new pt. PCP is Dr Hilma Favors.   Current Medications, Allergies, Past Medical History, Past Surgical History, Family History and Social History were reviewed in [employer] record.     Review of Systems: Patient denies any headaches, hearing loss, fatigue, blurred vision, shortness of breath, chest pain, abdominal pain, problems with bowel movements, urination, or intercourse. No joint pain or mood swings. Denies any vaginal bleeding.    Physical Exam:BP (!) 138/92 (BP Location: Left Arm, Patient Position: Sitting, Cuff Size: Normal)   Pulse 80   Ht 5\' 1"  (1.549 m)   Wt 238 lb 12.8 oz (108.3 kg)   LMP 12/05/2013   BMI 45.12 kg/m   General:  Well developed, well nourished, no acute distress Skin:  Warm and dry,multiple AKs and moles Neck:  Midline trachea, normal thyroid, good ROM, no lymphadenopathy Lungs; Clear to auscultation bilaterally Breast:  No dominant palpable mass, retraction, or nipple discharge Cardiovascular: Regular rate and rhythm Abdomen:  Soft, non tender, no hepatosplenomegaly Pelvic:  External genitalia is normal in appearance, no lesions.  Has skin tags inner thighs.The vagina is normal in appearance. Urethra has no lesions or masses. The cervix is smooth, pap with HR HPV genotyping performed.  Uterus is felt to be normal size, shape, and contour.  No adnexal masses or tenderness noted.Bladder is non tender, no masses felt. Rectal: Good sphincter tone, no polyps, or hemorrhoids felt.  Hemoccult negative. Extremities/musculoskeletal:  No swelling or varicosities noted, no clubbing or cyanosis Psych:  No mood changes, alert and cooperative,seems happy AA is 2 Fall risk is low Depression screen [facility] 2/9 06/06/2021  Decreased Interest 0   Down, Depressed, Hopeless 0  PHQ - 2 Score 0  Altered sleeping 1  Tired, decreased energy 1  Change in appetite 1  Feeling bad or failure about yourself  0  Trouble concentrating 0  Moving slowly or fidgety/restless 0  Suicidal thoughts 0  PHQ-9 Score 3    GAD 7 : Generalized Anxiety Score 06/06/2021  Nervous, Anxious, on Edge 0  Control/stop worrying 0  Worry too much - different things 0  Trouble relaxing 0  Restless 0  Easily annoyed or irritable 1  Afraid - awful might happen 0  Total GAD 7 Score 1      Upstream - 06/06/21 1430       Pregnancy Intention Screening   Does the patient want to become pregnant in the next year? No    Does the patient's partner want to become pregnant in the next year? No    Would the patient like to discuss contraceptive options today? No      Contraception Wrap Up   Current Method Vasectomy    End Method Vasectomy    Contraception Counseling Provided No            Pt gave verbal consent for exam without chaperone.   Impression and Plan: 1. Encounter for gynecological examination with Papanicolaou smear of cervix Pap sent Physical with PCP Pap in 3 years if normal Labs with PCP Mammogram yearly Colonoscopy per GI  2. Encounter for screening fecal occult blood testing

## 2021-06-08 ENCOUNTER — Ambulatory Visit: Payer: BC Managed Care – PPO | Admitting: Internal Medicine

## 2021-06-13 LAB — CYTOLOGY - PAP
Comment: NEGATIVE
Diagnosis: NEGATIVE
Diagnosis: REACTIVE
High risk HPV: NEGATIVE

## 2021-10-13 ENCOUNTER — Other Ambulatory Visit: Payer: Self-pay | Admitting: Obstetrics and Gynecology

## 2021-10-13 DIAGNOSIS — Z1231 Encounter for screening mammogram for malignant neoplasm of breast: Secondary | ICD-10-CM

## 2021-11-22 ENCOUNTER — Other Ambulatory Visit: Payer: Self-pay

## 2021-11-22 ENCOUNTER — Ambulatory Visit
Admission: RE | Admit: 2021-11-22 | Discharge: 2021-11-22 | Disposition: A | Discharge status: Home / Self Care | Payer: BC Managed Care – PPO | Source: Ambulatory Visit | Attending: Obstetrics and Gynecology | Admitting: Obstetrics and Gynecology

## 2021-11-22 DIAGNOSIS — Z1231 Encounter for screening mammogram for malignant neoplasm of breast: Secondary | ICD-10-CM

## 2022-02-24 ENCOUNTER — Other Ambulatory Visit: Payer: Self-pay | Admitting: Gastroenterology

## 2022-03-12 ENCOUNTER — Telehealth (INDEPENDENT_AMBULATORY_CARE_PROVIDER_SITE_OTHER): Payer: BC Managed Care – PPO | Admitting: Gastroenterology

## 2022-03-12 ENCOUNTER — Encounter: Payer: Self-pay | Admitting: Gastroenterology

## 2022-03-12 VITALS — Ht 62.0 in | Wt 230.0 lb

## 2022-03-12 DIAGNOSIS — R197 Diarrhea, unspecified: Secondary | ICD-10-CM

## 2022-03-12 DIAGNOSIS — Z86010 Personal history of colonic polyps: Secondary | ICD-10-CM | POA: Diagnosis not present

## 2022-03-12 DIAGNOSIS — K219 Gastro-esophageal reflux disease without esophagitis: Secondary | ICD-10-CM | POA: Diagnosis not present

## 2022-03-12 MED ORDER — COLESTIPOL HCL 1 G PO TABS: ORAL_TABLET | ORAL | 3 refills | Status: DC

## 2022-03-12 NOTE — Progress Notes (Signed)
? ? ? ? ?Primary Care Physician:  Sharilyn Sites, MD ?Primary GI:  Elon Alas. Abbey Chatters, DO ? ?Patient Location: Home ? ?Provider Location: [place] office ? ?Reason for Visit:  ?Chief Complaint  ?Patient presents with  ? Follow-up  ?  Refills on colestipol  ? ? ? ?Persons present on the virtual encounter, with roles: Patient, myself (provider),Tammy Clifton(updated meds and allergies) ? ?Total time (minutes) spent on medical discussion: 15 minutes ? ?Due to COVID-19, visit was conducted using Mychart video method.  Visit was requested by patient. ? ?Virtual Visit via Mychart video ? ?I connected with [name] on 03/12/22 at  3:30 PM EDT by Mychart video and verified that I am speaking with the correct person using two identifiers. ?  ?I discussed the limitations, risks, security and privacy concerns of performing an evaluation and management service by telephone/video and the availability of in person appointments. I also discussed with the patient that there may be a patient responsible charge related to this service. The patient expressed understanding and agreed to proceed. ? ? ?HPI:   ?[name] is a 56 y.o. female who presents for virtual visit regarding GERD, chronic diarrhea, h/o adenomatous colon polyps.  ? ?She has had diarrhea for couple of days, after returning from out of town.  Believes it something she ate.  Otherwise has been doing well on Colestid once daily.  Controls for postprandial urgency and diarrhea.  Heartburn doing okay overall.  Trying to avoid trigger foods.  No dysphagia.  No abdominal pain.  No unintentional weight loss.  She is due for 3-year surveillance colonoscopy in August, would like to try to get it done before going back to school.  Typically starts first week of August. ? ?Colonoscopy 06/2019: ?Four 3 to 5 mm polyps in the descending colon(2) and at the hepatic flexure(2), removed ?with a cold snare. Resected and retrieved. ?- Likely benign polypoid lesion at the hepatic  flexure. Biopsied. ?- MODERATE Diverticulosis in the recto-sigmoid colon, in the sigmoid colon and in the ?descending colon. ?- External and internal hemorrhoids. ? ?EGD 01/2007: ? FINAL DIAGNOSIS:  No evidence of Barrett's esophagus.  Small sliding ? hiatal hernia.  Nonerosive antral gastritis. ? ?Current Outpatient Medications  ?Medication Sig Dispense Refill  ? colestipol (COLESTID) 1 g tablet TAKE 1 TABLET(1 GRAM) BY MOUTH DAILY. DO NOT TAKE WITHIN 2 HOURS OF OTHER MEDICATIONS 90 tablet 1  ? esomeprazole (NEXIUM) 20 MG capsule Take 20 mg by mouth daily at 2 PM. In the afternoon.    ? olmesartan (BENICAR) 40 MG tablet Take 40 mg by mouth daily.    ? ?No current facility-administered medications for this visit.  ? ? ?Past Medical History:  ?Diagnosis Date  ? Anxiety   ? Cervical atypia, mild 12/2013  ? Sonohysterogram biopsy showed benign endometrium but inflamed mild atypia and squamous mucosa fragments recommend repeat Pap smear 6 months  ? GERD (gastroesophageal reflux disease)   ? HTN (hypertension)   ? Irritable bowel syndrome   ? Obesity   ? PONV (postoperative nausea and vomiting)   ? Sleep apnea   ? C PAP  ? Snoring   ? URI (upper respiratory infection)   ? ? ?Past Surgical History:  ?Procedure Laterality Date  ? BIOPSY  07/10/2019  ? Procedure: BIOPSY;  Surgeon: Danie Binder, MD;  [facility];  Service: Endoscopy;;  ? [place]  ? CIN 1, ECC positive  ? CESAREAN SECTION  10/2003  ? CHOLECYSTECTOMY  2006  ? COLONOSCOPY  2009  ? Dr. Oneida Alar: frequent sigmoid colon diverticula, small internal hemrorhoids, colonic biopsies to assess for microscopic colitis, Benign   ? COLONOSCOPY N/A 07/10/2019  ? Procedure: COLONOSCOPY;  Surgeon: Danie Binder, MD;  [facility];  Service: Endoscopy;  Laterality: N/A;  10:30am  ? GYNECOLOGIC CRYOSURGERY    ? MAXILLARY ANTROSTOMY Left 05/14/2016  ? Procedure: LEFT ENDOSCOPIC MAXILLARY ANTROSTOMY WITH FUSION NAVIGATION;  Surgeon: Leta Baptist, MD;  Location: [place];  Service: ENT;  Laterality: Left;  LEFT ENDOSCOPIC MAXILLARY ANTROSTOMY WITH FUSION NAVIGATION  ? NASAL SEPTOPLASTY W/ TURBINOPLASTY Bilateral 05/14/2016  ? Procedure: NASAL SEPTOPLASTY WITH TURBINATE REDUCTION;  Surgeon: Leta Baptist, MD;  Location: [place];  Service: ENT;  Laterality: Bilateral;  NASAL SEPTOPLASTY WITH TURBINATE REDUCTION  ? POLYPECTOMY  07/10/2019  ? Procedure: POLYPECTOMY;  Surgeon: Danie Binder, MD;  [facility];  Service: Endoscopy;;  ? SINUS ENDO WITH FUSION Right 05/14/2016  ? Procedure: RIGHT ENDOSCOPIC  ETHMOIDECTOMY AND RIGHT ENDOSCOPIC FRONTAL SINUS EXPLORATION;  Surgeon: Leta Baptist, MD;  Location: [place];  Service: ENT;  Laterality: Right;  RIGHT ENDOSCOPIC  ETHMOIDECTOMY AND RIGHT ENDOSCOPIC FRONTAL SINUS EXPLORATION  ? ? ?Family History  ?Problem Relation Age of Onset  ? Hypertension Father   ? Heart attack Father   ? Hypertension Mother   ? Hypertension Sister   ? Breast cancer Sister 37  ? Cervical cancer Sister   ? Colon polyps Neg Hx   ? Colon cancer Neg Hx   ? ? ?Social History  ? ?Socioeconomic History  ? Marital status: Married  ?  Spouse name: Not on file  ? Number of children: Not on file  ? Years of education: Not on file  ? Highest education level: Not on file  ?Occupational History  ? Occupation: Control and instrumentation engineer  ?  Employer: [employer]  ?Tobacco Use  ? Smoking status: Never  ? Smokeless tobacco: Never  ?Vaping Use  ? Vaping Use: Never used  ?Substance and Sexual Activity  ? Alcohol use: Yes  ?  Alcohol/week: 0.0 standard drinks  ?  Comment: socially  ? Drug use: No  ? Sexual activity: Yes  ?  Birth control/protection: Other-see comments  ?  Comment: vasectomy-1st intercourse 18 yo-5 partners  ?Other Topics Concern  ? Not on file  ?Social History Narrative  ? Drinks 1 cup of coffee and 12 oz ginger ale.  ? ?Social Determinants of Health  ? ?Financial Resource Strain: Low Risk    ? Difficulty of Paying Living Expenses: Not hard at all  ?Food Insecurity: No Food Insecurity  ? Worried About Charity fundraiser in the Last Year: Never true  ? Ran Out of Food in the Last Year: Never true  ?Transportation Needs: No Transportation Needs  ? Lack of Transportation (Medical): No  ? Lack of Transportation (Non-Medical): No  ?Physical Activity: Insufficiently Active  ? Days of Exercise per Week: 2 days  ? Minutes of Exercise per Session: 20 min  ?Stress: No Stress Concern Present  ? Feeling of Stress : Only a little  ?Social Connections: Socially Integrated  ? Frequency of Communication with Friends and Family: More than three times a week  ? Frequency of Social Gatherings with Friends and Family: Twice a week  ? Attends Religious Services: More than 4 times per year  ? Active Member of Clubs or Organizations: Yes  ?  Attends Archivist Meetings: More than 4 times per year  ? Marital Status: Married  ?Intimate Partner Violence: Not At Risk  ? Fear of Current or Ex-Partner: No  ? Emotionally Abused: No  ? Physically Abused: No  ? Sexually Abused: No  ? ? ?  ?ROS: ? ?General: Negative for anorexia, weight loss, fever, chills, fatigue, weakness. ?Eyes: Negative for vision changes.  ?ENT: Negative for hoarseness, difficulty swallowing , nasal congestion. ?CV: Negative for chest pain, angina, palpitations, dyspnea on exertion, peripheral edema.  ?Respiratory: Negative for dyspnea at rest, dyspnea on exertion, cough, sputum, wheezing.  ?GI: See history of present illness. ?GU:  Negative for dysuria, hematuria, urinary incontinence, urinary frequency, nocturnal urination.  ?MS: Negative for joint pain, low back pain.  ?Derm: Negative for rash or itching.  ?Neuro: Negative for weakness, abnormal sensation, seizure, frequent headaches, memory loss, confusion.  ?Psych: Negative for anxiety, depression, suicidal ideation, hallucinations.  ?Endo: Negative for unusual weight change.  ?Heme: Negative for  bruising or bleeding. ?Allergy: Negative for rash or hives. ?  ?Observations/Objective: ? ?Appears well.  No acute distress.  Pleasant. ? ?Assessment and Plan: ? ?Chronic diarrhea: Likely multifactorial inc

## 2022-03-12 NOTE — Patient Instructions (Signed)
New Colestid once daily to control diarrhea. ?Continue Nexium 20 mg daily as needed. ?We will plan for colonoscopy during the last 2 weeks of July.  If you have not heard from our office by the middle of June, please call and inquire about scheduling procedure. ? ? ?

## 2022-04-17 ENCOUNTER — Other Ambulatory Visit (HOSPITAL_COMMUNITY): Payer: Self-pay | Admitting: Family Medicine

## 2022-04-26 ENCOUNTER — Other Ambulatory Visit (HOSPITAL_COMMUNITY): Payer: Self-pay | Admitting: Family Medicine

## 2022-05-03 ENCOUNTER — Other Ambulatory Visit (HOSPITAL_COMMUNITY): Payer: Self-pay | Admitting: Family Medicine

## 2022-05-03 DIAGNOSIS — N6310 Unspecified lump in the right breast, unspecified quadrant: Secondary | ICD-10-CM

## 2022-05-31 ENCOUNTER — Other Ambulatory Visit (HOSPITAL_COMMUNITY): Payer: BC Managed Care – PPO

## 2022-05-31 ENCOUNTER — Ambulatory Visit (HOSPITAL_COMMUNITY): Payer: BC Managed Care – PPO

## 2022-05-31 ENCOUNTER — Encounter (HOSPITAL_COMMUNITY): Payer: BC Managed Care – PPO

## 2022-06-07 ENCOUNTER — Encounter: Payer: Self-pay | Admitting: *Deleted

## 2022-06-19 ENCOUNTER — Ambulatory Visit (HOSPITAL_COMMUNITY)
Admission: RE | Admit: 2022-06-19 | Discharge: 2022-06-19 | Disposition: A | Discharge status: Home / Self Care | Payer: BC Managed Care – PPO | Source: Ambulatory Visit | Attending: Family Medicine | Admitting: Family Medicine

## 2022-06-19 DIAGNOSIS — N6310 Unspecified lump in the right breast, unspecified quadrant: Secondary | ICD-10-CM | POA: Insufficient documentation

## 2022-06-20 ENCOUNTER — Encounter: Payer: Self-pay | Admitting: *Deleted

## 2022-06-20 NOTE — Patient Instructions (Signed)
  Procedure: Colonoscopy but also wrote on form that she was told she may need possible EGD with her TCS  Estimated body mass index is 44.4 kg/m as calculated from the following:   Height as of this encounter: '5\' 1"'$  (1.549 m).   Weight as of this encounter: 235 lb (106.6 kg).   Have you had a colonoscopy before?  Yes 07/10/19, Dr. Oneida Alar  Do you have family history of colon cancer  no  Do you have a family history of polyps? no  Previous colonoscopy with polyps removed? ? Unsure   Do you have a history colorectal cancer?   no  Are you diabetic?  no  Do you have a prosthetic or mechanical heart valve? no  Do you have a pacemaker/defibrillator?   no  Have you had endocarditis/atrial fibrillation?  no  Do you use supplemental oxygen/CPAP?  yes face mask ONLY  Have you had joint replacement within the last 12 months?  no  Do you tend to be constipated or have to use laxatives?  no   Do you have history of alcohol use? If yes, how much and how often.  no  Do you have history or are you using drugs? If yes, what do are you  using?  no  Have you ever had a stroke/heart attack?    Have you ever had a heart or other vascular stent placed,?  Do you take weight loss medication? no  female patients,: have you had a hysterectomy? no                              are you post menopausal?                                do you still have your menstrual cycle? no    Date of last menstrual period.   Do you take any blood-thinning medications such as: (Plavix, aspirin, Coumadin, Aggrenox, Brilinta, Xarelto, Eliquis, Pradaxa, Savaysa or Effient) no  If yes we need the name, milligram, dosage and who is prescribing doctor:               Current Outpatient Medications  Medication Sig Dispense Refill   colestipol (COLESTID) 1 g tablet TAKE 1 TABLET(1 GRAM) BY MOUTH DAILY. DO NOT TAKE WITHIN 2 HOURS OF OTHER MEDICATIONS 90 tablet 3   esomeprazole (NEXIUM) 20 MG capsule Take 20 mg by mouth  daily at 2 PM. In the afternoon.     olmesartan (BENICAR) 40 MG tablet Take 40 mg by mouth daily.     No current facility-administered medications for this visit.    Allergies  Allergen Reactions   Penicillins Shortness Of Breath    Has patient had a PCN reaction causing immediate rash, facial/tongue/throat swelling, SOB or lightheadedness with hypotension:  YES Has patient had a PCN reaction causing severe rash involving mucus membranes or skin necrosis:  NO Has patient had a PCN reaction that required hospitalization NO  Has patient had a PCN reaction occurring within the last 10 years: NO If all of the above answers are "NO", then may proceed with Ceph

## 2022-06-22 NOTE — Progress Notes (Signed)
Spoke with patient. I saw her back in 02/2022. She has chronic GERD on PPI for 18 years. EGD remotely in 2008. Some recent dysphagia off PPI (advised by PCP to stop), some improvement in swallowing after restarting PPI. Offered her EGD+/-ED at time of surveillance colonoscopy. Dx: chronic gerd, dysphagia, screen for barrett's.  Please schedule TCS/EGD+/-ED with Abbey Chatters. ASA 3 due to BMI. Hold colestid for 5 days before prep.

## 2022-06-27 ENCOUNTER — Encounter: Payer: Self-pay | Admitting: *Deleted

## 2022-06-27 MED ORDER — PEG 3350-KCL-NA BICARB-NACL 420 G PO SOLR: 4000.0000 mL | Freq: Once | ORAL | 0 refills | Status: AC

## 2022-06-27 NOTE — Progress Notes (Signed)
Pt called back. She wants September procedure. Advised will call once we receive this schedule.

## 2022-06-27 NOTE — Progress Notes (Signed)
Patient scheduled for 8/21 at 10:45am. Aware will mail prep instructions/pre-op appt. Rx sent to pharmacy.

## 2022-07-02 ENCOUNTER — Encounter: Payer: Self-pay | Admitting: *Deleted

## 2022-07-02 NOTE — Progress Notes (Signed)
TR scheduled patient for 9/18 at 7:30am. Pt already has prep at home. Will mail instructions/pre-op appt.

## 2022-07-03 ENCOUNTER — Encounter: Payer: Self-pay | Admitting: *Deleted

## 2022-07-16 ENCOUNTER — Encounter (HOSPITAL_COMMUNITY): Payer: Self-pay

## 2022-07-16 ENCOUNTER — Ambulatory Visit (HOSPITAL_COMMUNITY): Admit: 2022-07-16 | Payer: BC Managed Care – PPO

## 2022-07-16 SURGERY — COLONOSCOPY WITH PROPOFOL
Anesthesia: Monitor Anesthesia Care

## 2022-08-07 NOTE — Patient Instructions (Signed)
[name]  08/07/2022     '@PREFPERIOPPHARMACY'$ @   Your procedure is scheduled on  08/13/2022.   Report to [facility] at  0600  A.M.   Call this number if you have problems the morning of surgery:  [phone]   Remember:  Follow the diet and prep instructions given to you by the office.     Take these medicines the morning of surgery with A SIP OF WATER                                                       nexium.     Do not wear jewelry, make-up or nail polish.  Do not wear lotions, powders, or perfumes, or deodorant.  Do not shave 48 hours prior to surgery.  Men may shave face and neck.  Do not bring valuables to the hospital.  [facility] is not responsible for any belongings or valuables.  Contacts, dentures or bridgework may not be worn into surgery.  Leave your suitcase in the car.  After surgery it may be brought to your room.  For patients admitted to the hospital, discharge time will be determined by your treatment team.  Patients discharged the day of surgery will not be allowed to drive home and must have someone with them for 24 hours.    Special instructions:   DO NOT smoke tobacco or vape for 24 hours before your procedure.  Please read over the following fact sheets that you were given. Anesthesia Post-op Instructions and Care and Recovery After Surgery      Upper Endoscopy, Adult, Care After After the procedure, it is common to have a sore throat. It is also common to have: Mild stomach pain or discomfort. Bloating. Nausea. Follow these instructions at home: The instructions below may help you care for yourself at home. Your health care provider may give you more instructions. If you have questions, ask your health care provider. If you were given a sedative during the procedure, it can affect you for several hours. Do not drive or operate machinery until your health care provider says that it is safe. If you will be going home  right after the procedure, plan to have a responsible adult: Take you home from the hospital or clinic. You will not be allowed to drive. Care for you for the time you are told. Follow instructions from your health care provider about what you may eat and drink. Return to your normal activities as told by your health care provider. Ask your health care provider what activities are safe for you. Take over-the-counter and prescription medicines only as told by your health care provider. Contact a health care provider if you: Have a sore throat that lasts longer than one day. Have trouble swallowing. Have a fever. Get help right away if you: Vomit blood or your vomit looks like coffee grounds. Have bloody, black, or tarry stools. Have a very bad sore throat or you cannot swallow. Have difficulty breathing or very bad pain in your chest or abdomen. These symptoms may be an emergency. Get help right away. Call 911. Do not wait to see if the symptoms will go away. Do not drive yourself to the hospital. Summary After the procedure, it is common  to have a sore throat, mild stomach discomfort, bloating, and nausea. If you were given a sedative during the procedure, it can affect you for several hours. Do not drive until your health care provider says that it is safe. Follow instructions from your health care provider about what you may eat and drink. Return to your normal activities as told by your health care provider. This information is not intended to replace advice given to you by your health care provider. Make sure you discuss any questions you have with your health care provider. Document Revised: 02/21/2022 Document Reviewed: 02/21/2022 Elsevier Patient Education  [place]. Esophageal Dilatation Esophageal dilatation, also called esophageal dilation, is a procedure to widen or open a blocked or narrowed part of the esophagus. The esophagus is the part of the body that moves food  and liquid from the mouth to the stomach. You may need this procedure if: You have a buildup of scar tissue in your esophagus that makes it difficult, painful, or impossible to swallow. This can be caused by gastroesophageal reflux disease (GERD). You have cancer of the esophagus. There is a problem with how food moves through your esophagus. In some cases, you may need this procedure repeated at a later time to dilate the esophagus gradually. Tell a health care provider about: Any allergies you have. All medicines you are taking, including vitamins, herbs, eye drops, creams, and over-the-counter medicines. Any problems you or family members have had with anesthetic medicines. Any blood disorders you have. Any surgeries you have had. Any medical conditions you have. Any antibiotic medicines you are required to take before dental procedures. Whether you are pregnant or may be pregnant. What are the risks? Generally, this is a safe procedure. However, problems may occur, including: Bleeding due to a tear in the lining of the esophagus. A hole, or perforation, in the esophagus. What happens before the procedure? Ask your health care provider about: Changing or stopping your regular medicines. This is especially important if you are taking diabetes medicines or blood thinners. Taking medicines such as aspirin and ibuprofen. These medicines can thin your blood. Do not take these medicines unless your health care provider tells you to take them. Taking over-the-counter medicines, vitamins, herbs, and supplements. Follow instructions from your health care provider about eating or drinking restrictions. Plan to have a responsible adult take you home from the hospital or clinic. Plan to have a responsible adult care for you for the time you are told after you leave the hospital or clinic. This is important. What happens during the procedure? You may be given a medicine to help you relax  (sedative). A numbing medicine may be sprayed into the back of your throat, or you may gargle the medicine. Your health care provider may perform the dilatation using various surgical instruments, such as: Simple dilators. This instrument is carefully placed in the esophagus to stretch it. Guided wire bougies. This involves using an endoscope to insert a wire into the esophagus. A dilator is passed over this wire to enlarge the esophagus. Then the wire is removed. Balloon dilators. An endoscope with a small balloon is inserted into the esophagus. The balloon is inflated to stretch the esophagus and open it up. The procedure may vary among health care providers and hospitals. What can I expect after the procedure? Your blood pressure, heart rate, breathing rate, and blood oxygen level will be monitored until you leave the hospital or clinic. Your throat may feel slightly sore  and numb. This will get better over time. You will not be allowed to eat or drink until your throat is no longer numb. When you are able to drink, urinate, and sit on the edge of the bed without nausea or dizziness, you may be able to return home. Follow these instructions at home: Take over-the-counter and prescription medicines only as told by your health care provider. If you were given a sedative during the procedure, it can affect you for several hours. Do not drive or operate machinery until your health care provider says that it is safe. Plan to have a responsible adult care for you for the time you are told. This is important. Follow instructions from your health care provider about any eating or drinking restrictions. Do not use any products that contain nicotine or tobacco, such as cigarettes, e-cigarettes, and chewing tobacco. If you need help quitting, ask your health care provider. Keep all follow-up visits. This is important. Contact a health care provider if: You have a fever. You have pain that is not  relieved by medicine. Get help right away if: You have chest pain. You have trouble breathing. You have trouble swallowing. You vomit blood. You have black, tarry, or bloody stools. These symptoms may represent a serious problem that is an emergency. Do not wait to see if the symptoms will go away. Get medical help right away. Call your local emergency services (911 in the [place] Do not drive yourself to the hospital. Summary Esophageal dilatation, also called esophageal dilation, is a procedure to widen or open a blocked or narrowed part of the esophagus. Plan to have a responsible adult take you home from the hospital or clinic. For this procedure, a numbing medicine may be sprayed into the back of your throat, or you may gargle the medicine. Do not drive or operate machinery until your health care provider says that it is safe. This information is not intended to replace advice given to you by your health care provider. Make sure you discuss any questions you have with your health care provider. Document Revised: 03/30/2020 Document Reviewed: 03/30/2020 Elsevier Patient Education  [place]. Colonoscopy, Adult, Care After The following information offers guidance on how to care for yourself after your procedure. Your health care provider may also give you more specific instructions. If you have problems or questions, contact your health care provider. What can I expect after the procedure? After the procedure, it is common to have: A small amount of blood in your stool for 24 hours after the procedure. Some gas. Mild cramping or bloating of your abdomen. Follow these instructions at home: Eating and drinking  Drink enough fluid to keep your urine pale yellow. Follow instructions from your health care provider about eating or drinking restrictions. Resume your normal diet as told by your health care provider. Avoid heavy or fried foods that are hard to digest. Activity Rest  as told by your health care provider. Avoid sitting for a long time without moving. Get up to take short walks every 1-2 hours. This is important to improve blood flow and breathing. Ask for help if you feel weak or unsteady. Return to your normal activities as told by your health care provider. Ask your health care provider what activities are safe for you. Managing cramping and bloating  Try walking around when you have cramps or feel bloated. If directed, apply heat to your abdomen as told by your health care provider. Use the heat source  that your health care provider recommends, such as a moist heat pack or a heating pad. Place a towel between your skin and the heat source. Leave the heat on for 20-30 minutes. Remove the heat if your skin turns bright red. This is especially important if you are unable to feel pain, heat, or cold. You have a greater risk of getting burned. General instructions If you were given a sedative during the procedure, it can affect you for several hours. Do not drive or operate machinery until your health care provider says that it is safe. For the first 24 hours after the procedure: Do not sign important documents. Do not drink alcohol. Do your regular daily activities at a slower pace than normal. Eat soft foods that are easy to digest. Take over-the-counter and prescription medicines only as told by your health care provider. Keep all follow-up visits. This is important. Contact a health care provider if: You have blood in your stool 2-3 days after the procedure. Get help right away if: You have more than a small spotting of blood in your stool. You have large blood clots in your stool. You have swelling of your abdomen. You have nausea or vomiting. You have a fever. You have increasing pain in your abdomen that is not relieved with medicine. These symptoms may be an emergency. Get help right away. Call 911. Do not wait to see if the symptoms will go  away. Do not drive yourself to the hospital. Summary After the procedure, it is common to have a small amount of blood in your stool. You may also have mild cramping and bloating of your abdomen. If you were given a sedative during the procedure, it can affect you for several hours. Do not drive or operate machinery until your health care provider says that it is safe. Get help right away if you have a lot of blood in your stool, nausea or vomiting, a fever, or increased pain in your abdomen. This information is not intended to replace advice given to you by your health care provider. Make sure you discuss any questions you have with your health care provider. Document Revised: 07/05/2021 Document Reviewed: 07/05/2021 Elsevier Patient Education  [place] After This sheet gives you information about how to care for yourself after your procedure. Your health care provider may also give you more specific instructions. If you have problems or questions, contact your health care provider. What can I expect after the procedure? After the procedure, it is common to have: Tiredness. Forgetfulness about what happened after the procedure. Impaired judgment for important decisions. Nausea or vomiting. Some difficulty with balance. Follow these instructions at home: For the time period you were told by your health care provider:     Rest as needed. Do not participate in activities where you could fall or become injured. Do not drive or use machinery. Do not drink alcohol. Do not take sleeping pills or medicines that cause drowsiness. Do not make important decisions or sign legal documents. Do not take care of children on your own. Eating and drinking Follow the diet that is recommended by your health care provider. Drink enough fluid to keep your urine pale yellow. If you vomit: Drink water, juice, or soup when you can drink without vomiting. Make  sure you have little or no nausea before eating solid foods. General instructions Have a responsible adult stay with you for the time you are told. It is important  to have someone help care for you until you are awake and alert. Take over-the-counter and prescription medicines only as told by your health care provider. If you have sleep apnea, surgery and certain medicines can increase your risk for breathing problems. Follow instructions from your health care provider about wearing your sleep device: Anytime you are sleeping, including during daytime naps. While taking prescription pain medicines, sleeping medicines, or medicines that make you drowsy. Avoid smoking. Keep all follow-up visits as told by your health care provider. This is important. Contact a health care provider if: You keep feeling nauseous or you keep vomiting. You feel light-headed. You are still sleepy or having trouble with balance after 24 hours. You develop a rash. You have a fever. You have redness or swelling around the IV site. Get help right away if: You have trouble breathing. You have new-onset confusion at home. Summary For several hours after your procedure, you may feel tired. You may also be forgetful and have poor judgment. Have a responsible adult stay with you for the time you are told. It is important to have someone help care for you until you are awake and alert. Rest as told. Do not drive or operate machinery. Do not drink alcohol or take sleeping pills. Get help right away if you have trouble breathing, or if you suddenly become confused. This information is not intended to replace advice given to you by your health care provider. Make sure you discuss any questions you have with your health care provider. Document Revised: 10/17/2021 Document Reviewed: 10/15/2019 Elsevier Patient Education  [place].

## 2022-08-09 ENCOUNTER — Telehealth: Payer: Self-pay | Admitting: *Deleted

## 2022-08-09 ENCOUNTER — Encounter (HOSPITAL_COMMUNITY)
Admission: RE | Admit: 2022-08-09 | Discharge: 2022-08-09 | Disposition: A | Discharge status: Home / Self Care | Payer: BC Managed Care – PPO | Source: Ambulatory Visit | Attending: Internal Medicine | Admitting: Internal Medicine

## 2022-08-09 DIAGNOSIS — E669 Obesity, unspecified: Secondary | ICD-10-CM

## 2022-08-09 DIAGNOSIS — I10 Essential (primary) hypertension: Secondary | ICD-10-CM

## 2022-08-09 NOTE — Telephone Encounter (Signed)
-----   Message from Wilmer Floor, RN sent at 08/09/2022  9:08 AM EDT ----- Regarding: Ms Mccamish needs to reschedule her EGD/TCS until the first of the year Ms Harral has been trying to call the office to reschedule her EGD/TCS for 08/13/2022.  She wants to reschedule for the first of the year.  She was the 7:30 am case  Thanks,  Rosalyn Gess RN

## 2022-08-09 NOTE — Telephone Encounter (Signed)
[name] please add to recall list for appt in January to get her rescheduled. Thanks!

## 2022-08-13 ENCOUNTER — Encounter (HOSPITAL_COMMUNITY): Admission: RE | Payer: Self-pay | Source: Home / Self Care

## 2022-08-13 ENCOUNTER — Ambulatory Visit (HOSPITAL_COMMUNITY): Admission: RE | Admit: 2022-08-13 | Payer: BC Managed Care – PPO | Source: Home / Self Care

## 2022-08-13 SURGERY — COLONOSCOPY WITH PROPOFOL
Anesthesia: Monitor Anesthesia Care

## 2022-08-21 NOTE — Telephone Encounter (Signed)
On recall  °

## 2022-11-29 ENCOUNTER — Other Ambulatory Visit (HOSPITAL_COMMUNITY): Payer: Self-pay | Admitting: Family Medicine

## 2022-11-29 DIAGNOSIS — N611 Abscess of the breast and nipple: Secondary | ICD-10-CM

## 2022-12-13 ENCOUNTER — Encounter (HOSPITAL_COMMUNITY): Payer: Self-pay

## 2022-12-13 ENCOUNTER — Ambulatory Visit (HOSPITAL_COMMUNITY)
Admission: RE | Admit: 2022-12-13 | Discharge: 2022-12-13 | Disposition: A | Discharge status: Home / Self Care | Payer: BC Managed Care – PPO | Source: Ambulatory Visit | Attending: Family Medicine | Admitting: Family Medicine

## 2022-12-13 ENCOUNTER — Other Ambulatory Visit (HOSPITAL_COMMUNITY): Payer: Self-pay | Admitting: Family Medicine

## 2022-12-13 DIAGNOSIS — N611 Abscess of the breast and nipple: Secondary | ICD-10-CM

## 2022-12-13 DIAGNOSIS — Z1231 Encounter for screening mammogram for malignant neoplasm of breast: Secondary | ICD-10-CM

## 2023-02-06 ENCOUNTER — Ambulatory Visit (HOSPITAL_COMMUNITY)
Admission: RE | Admit: 2023-02-06 | Discharge: 2023-02-06 | Disposition: A | Discharge status: Home / Self Care | Payer: BC Managed Care – PPO | Source: Ambulatory Visit | Attending: Family Medicine | Admitting: Family Medicine

## 2023-02-06 ENCOUNTER — Other Ambulatory Visit (HOSPITAL_COMMUNITY): Payer: Self-pay | Admitting: Family Medicine

## 2023-02-06 DIAGNOSIS — R0602 Shortness of breath: Secondary | ICD-10-CM

## 2023-03-12 ENCOUNTER — Other Ambulatory Visit: Payer: Self-pay | Admitting: Gastroenterology

## 2023-03-29 ENCOUNTER — Ambulatory Visit: Payer: BC Managed Care – PPO | Admitting: Internal Medicine

## 2023-04-08 ENCOUNTER — Encounter: Payer: Self-pay | Admitting: Internal Medicine

## 2023-04-08 ENCOUNTER — Ambulatory Visit: Payer: BC Managed Care – PPO | Attending: Internal Medicine | Admitting: Internal Medicine

## 2023-04-08 ENCOUNTER — Other Ambulatory Visit (HOSPITAL_COMMUNITY)
Admission: RE | Admit: 2023-04-08 | Discharge: 2023-04-08 | Disposition: A | Discharge status: Home / Self Care | Payer: BC Managed Care – PPO | Source: Ambulatory Visit | Attending: Internal Medicine | Admitting: Internal Medicine

## 2023-04-08 VITALS — BP 130/90 | HR 92 | Ht 61.0 in | Wt 228.6 lb

## 2023-04-08 DIAGNOSIS — I10 Essential (primary) hypertension: Secondary | ICD-10-CM

## 2023-04-08 DIAGNOSIS — R0609 Other forms of dyspnea: Secondary | ICD-10-CM

## 2023-04-08 LAB — BRAIN NATRIURETIC PEPTIDE: B Natriuretic Peptide: 21 pg/mL (ref 0.0–100.0)

## 2023-04-08 MED ORDER — FUROSEMIDE 20 MG PO TABS: 20.0000 mg | ORAL_TABLET | Freq: Every day | ORAL | 3 refills | Status: DC

## 2023-04-08 NOTE — Patient Instructions (Signed)
Medication Instructions:  Your physician has recommended you make the following change in your medication:   - Start Lasix 20 mg tablet once daily.   *If you need a refill on your cardiac medications before your next appointment, please call your pharmacy*   Lab Work: BNP  If you have labs (blood work) drawn today and your tests are completely normal, you will receive your results only by: MyChart Message (if you have MyChart) OR A paper copy in the mail If you have any lab test that is abnormal or we need to change your treatment, we will call you to review the results.   Testing/Procedures: Your physician has requested that you have an echocardiogram. Echocardiography is a painless test that uses sound waves to create images of your heart. It provides your doctor with information about the size and shape of your heart and how well your heart's chambers and valves are working. This procedure takes approximately one hour. There are no restrictions for this procedure. Please do NOT wear cologne, perfume, aftershave, or lotions (deodorant is allowed). Please arrive 15 minutes prior to your appointment time.    Follow-Up: At [facility], you and your health needs are our priority.  As part of our continuing mission to provide you with exceptional heart care, we have created designated Provider Care Teams.  These Care Teams include your primary Cardiologist (physician) and Advanced Practice Providers (APPs -  Physician Assistants and Nurse Practitioners) who all work together to provide you with the care you need, when you need it.  We recommend signing up for the patient portal called "MyChart".  Sign up information is provided on this After Visit Summary.  MyChart is used to connect with patients for Virtual Visits (Telemedicine).  Patients are able to view lab/test results, encounter notes, upcoming appointments, etc.  Non-urgent messages can be sent to your provider as well.    To learn more about what you can do with MyChart, go to [email or web address].    Your next appointment:   3 month(s)  Provider:   Luane School, MD    Other Instructions

## 2023-04-14 DIAGNOSIS — R0602 Shortness of breath: Secondary | ICD-10-CM | POA: Insufficient documentation

## 2023-04-14 DIAGNOSIS — I10 Essential (primary) hypertension: Secondary | ICD-10-CM | POA: Insufficient documentation

## 2023-04-14 DIAGNOSIS — R0609 Other forms of dyspnea: Secondary | ICD-10-CM | POA: Insufficient documentation

## 2023-04-14 NOTE — Progress Notes (Signed)
Cardiology Office Note  Date: 04/14/2023   ID: [name], DOB 04/13/1966, MRN [id number]  PCP:  Assunta Found, MD  Cardiologist:  Marjo Bicker, MD Electrophysiologist:  None   Reason for Office Visit: Evaluation of SOB at the request of Dr. Phillips Odor   History of Present Illness: [name] is a 57 y.o. female known to have HTN, OSA on CPAP was referred to cardiology clinic for evaluation of SOB.  Patient reported having SOB for few months, occurs especially at rest (that she had to take a few deep breaths) and sometimes at night.  She came back from a long trip where she walked for many miles with no symptoms of SOB. But there could also be possibility that she was trying to catch up with her family members and did not notice SOB. She was diagnosed with OSA in the past and currently on CPAP. She got a notification that she was using CPAP for more number of hours.  She is scheduled to see PCP/sleep medicine for CPAP management.  Denies any angina, palpitations, dizziness or syncope.  No leg swelling.  Past Medical History:  Diagnosis Date   Anxiety    Cervical atypia, mild 12/2013   Sonohysterogram biopsy showed benign endometrium but inflamed mild atypia and squamous mucosa fragments recommend repeat Pap smear 6 months   GERD (gastroesophageal reflux disease)    HTN (hypertension)    Irritable bowel syndrome    Obesity    PONV (postoperative nausea and vomiting)    Sleep apnea    C PAP   Snoring    URI (upper respiratory infection)     Past Surgical History:  Procedure Laterality Date   BIOPSY  07/10/2019   Procedure: BIOPSY;  Surgeon: West Bali, MD;  [facility];  Service: Endoscopy;;   CERVICAL CONE BIOPSY  11/27/1987   CIN 1, ECC positive   CESAREAN SECTION  10/27/2003   CHOLECYSTECTOMY  11/26/2004   COLONOSCOPY  11/27/2007   Dr. Darrick Penna: frequent sigmoid colon diverticula, small internal hemrorhoids, colonic biopsies to assess for  microscopic colitis, Benign    COLONOSCOPY N/A 07/10/2019   Diverticulosis, external/internal hemorrhoids, multiple tubular adenomas.  Next colonoscopy in 3 years.  Procedure: COLONOSCOPY;  Surgeon: West Bali, MD;  [facility];  Service: Endoscopy;  Laterality: N/A;  10:30am   ESOPHAGOGASTRODUODENOSCOPY  2008   Small sliding hiatal hernia, nonerosive antral gastritis, H. pylori serologies checked by Dr. Karilyn Cota, results unavailable.  No signs of Barrett's.   GYNECOLOGIC CRYOSURGERY     MAXILLARY ANTROSTOMY Left 05/14/2016   Procedure: LEFT ENDOSCOPIC MAXILLARY ANTROSTOMY WITH FUSION NAVIGATION;  Surgeon: Newman Pies, MD;  Location: [facility];  Service: ENT;  Laterality: Left;  LEFT ENDOSCOPIC MAXILLARY ANTROSTOMY WITH FUSION NAVIGATION   NASAL SEPTOPLASTY W/ TURBINOPLASTY Bilateral 05/14/2016   Procedure: NASAL SEPTOPLASTY WITH TURBINATE REDUCTION;  Surgeon: Newman Pies, MD;  Location: [facility];  Service: ENT;  Laterality: Bilateral;  NASAL SEPTOPLASTY WITH TURBINATE REDUCTION   POLYPECTOMY  07/10/2019   Procedure: POLYPECTOMY;  Surgeon: West Bali, MD;  [facility];  Service: Endoscopy;;   SINUS ENDO WITH FUSION Right 05/14/2016   Procedure: RIGHT ENDOSCOPIC  ETHMOIDECTOMY AND RIGHT ENDOSCOPIC FRONTAL SINUS EXPLORATION;  Surgeon: Newman Pies, MD;  Location: [facility];  Service: ENT;  Laterality: Right;  RIGHT ENDOSCOPIC  ETHMOIDECTOMY AND RIGHT ENDOSCOPIC FRONTAL SINUS EXPLORATION    Current Outpatient Medications  Medication Sig  Dispense Refill   Ascorbic Acid (VITAMIN C ADULT GUMMIES PO) Take 2 each by mouth daily.     colestipol (COLESTID) 1 g tablet TAKE 1 TABLET(1 GRAM) BY MOUTH DAILY. DO NOT TAKE WITHIN 2 HOURS OF OTHER MEDICATIONS 90 tablet 3   esomeprazole (NEXIUM) 20 MG capsule Take 20 mg by mouth daily at 2 PM. In the afternoon.     furosemide (LASIX) 20 MG tablet Take 1 tablet (20 mg total) by mouth daily. 90  tablet 3   olmesartan (BENICAR) 40 MG tablet Take 40 mg by mouth daily.     No current facility-administered medications for this visit.   Allergies:  Penicillins   Social History: The patient  reports that she has never smoked. She has never used smokeless tobacco. She reports current alcohol use. She reports that she does not use drugs.   Family History: The patient's family history includes Breast cancer (age of onset: 59) in her sister; Cervical cancer in her sister; Heart attack in her father; Hypertension in her father, mother, and sister.   ROS:  Please see the history of present illness. Otherwise, complete review of systems is positive for none.  All other systems are reviewed and negative.   Physical Exam: VS:  BP (!) 130/90   Pulse 92   Ht 5\' 1"  (1.549 m)   Wt 228 lb 9.6 oz (103.7 kg)   LMP 12/05/2013   SpO2 98%   BMI 43.19 kg/m , BMI Body mass index is 43.19 kg/m.  Wt Readings from Last 3 Encounters:  04/08/23 228 lb 9.6 oz (103.7 kg)  06/20/22 235 lb (106.6 kg)  03/12/22 230 lb (104.3 kg)    General: Patient appears comfortable at rest. HEENT: Conjunctiva and lids normal, oropharynx clear with moist mucosa. Neck: Supple, no elevated JVP or carotid bruits, no thyromegaly. Lungs: Clear to auscultation, nonlabored breathing at rest. Cardiac: Regular rate and rhythm, no S3 or significant systolic murmur, no pericardial rub. Abdomen: Soft, nontender, no hepatomegaly, bowel sounds present, no guarding or rebound. Extremities: No pitting edema, distal pulses 2+. Skin: Warm and dry. Musculoskeletal: No kyphosis. Neuropsychiatric: Alert and oriented x3, affect grossly appropriate.  Recent Labwork: 04/08/2023: B Natriuretic Peptide 21.0     Component Value Date/Time   CHOL 156 12/03/2013 1646   TRIG 179 (H) 12/03/2013 1646   HDL 39 (L) 12/03/2013 1646   CHOLHDL 4.0 12/03/2013 1646   VLDL 36 12/03/2013 1646   LDLCALC 81 12/03/2013 1646    Other Studies Reviewed  Today:   Assessment and Plan: Patient is a 57 year old F known to have HTN, OSA on CPAP was referred to cardiology clinic for evaluation of SOB.  # SOB, rule out HFpEF -Patient symptoms are very atypical.  She has SOB at rest (had to take deep breaths multiple times for a few seconds) and symptoms at night.  No SOB with exertion (as she came back from a long trip and noticed no SOB with walking).  Will obtain 2D echocardiogram and start p.o. Lasix 20 mg once daily.  If her symptoms do not improve, instructed her to take Lasix 40 mg once daily.  Can stop if no improvement in symptoms.  BNP is normal but this can be normal in obese patients.  Will reevaluate the symptoms in 3 months.  Obtain 2D echocardiogram.  # HTN, controlled -Continue olmesartan 40 mg once daily  # OSA on CPAP -She got an alert that she was using CPAP for more number of hours.  She is scheduled to see his PCP/sleep medicine for CPAP management.   I have spent a total of 45 minutes with patient reviewing chart, EKGs, labs and examining patient as well as establishing an assessment and plan that was discussed with the patient.  > 50% of time was spent in direct patient care.    Medication Adjustments/Labs and Tests Ordered: Current medicines are reviewed at length with the patient today.  Concerns regarding medicines are outlined above.   Tests Ordered: Orders Placed This Encounter  Procedures   B Nat Peptide   EKG 12-Lead   ECHOCARDIOGRAM COMPLETE    Medication Changes: Meds ordered this encounter  Medications   furosemide (LASIX) 20 MG tablet    Sig: Take 1 tablet (20 mg total) by mouth daily.    Dispense:  90 tablet    Refill:  3    Disposition:  Follow up  3 months  Signed, Vauda Salvucci Verne Spurr, MD, 04/14/2023 6:38 AM    [facility] at [facility] [address] [place], [place] [address]

## 2023-04-29 ENCOUNTER — Ambulatory Visit (HOSPITAL_COMMUNITY)
Admission: RE | Admit: 2023-04-29 | Discharge: 2023-04-29 | Disposition: A | Discharge status: Home / Self Care | Payer: BC Managed Care – PPO | Source: Ambulatory Visit | Attending: Internal Medicine | Admitting: Internal Medicine

## 2023-04-29 DIAGNOSIS — R0609 Other forms of dyspnea: Secondary | ICD-10-CM | POA: Insufficient documentation

## 2023-04-29 LAB — ECHOCARDIOGRAM COMPLETE
AR max vel: 2.41 cm2
AV Area VTI: 2.28 cm2
AV Area mean vel: 2.13 cm2
AV Mean grad: 8 mmHg
AV Peak grad: 13.5 mmHg
Ao pk vel: 1.84 m/s
Area-P 1/2: 3.48 cm2
S' Lateral: 2.9 cm

## 2023-04-29 NOTE — Progress Notes (Signed)
*  PRELIMINARY RESULTS* Echocardiogram 2D Echocardiogram has been performed.  Stacey Drain 04/29/2023, 12:10 PM

## 2023-06-11 ENCOUNTER — Telehealth: Payer: Self-pay | Admitting: Internal Medicine

## 2023-06-11 NOTE — Telephone Encounter (Signed)
Patient stopped by the office and wanted to know if she can just reschedule the EGD/TCS or does she need an appointment first?

## 2023-06-11 NOTE — Telephone Encounter (Signed)
OV 02/2022 and then was triaged for tcs/egd. Did not have procedures done. Saw Verlon Au last. Does she need OV or can she be triaged again?

## 2023-06-13 NOTE — Telephone Encounter (Signed)
If it were just the colonoscopy I would say we could triage but since she was needing EGD/ED previously, I would advise ov or virtual visit.

## 2023-06-21 NOTE — Telephone Encounter (Signed)
Pt is coming Monday at 0800 to see LSL

## 2023-06-24 ENCOUNTER — Ambulatory Visit: Payer: BC Managed Care – PPO | Admitting: Gastroenterology

## 2023-06-24 ENCOUNTER — Encounter: Payer: Self-pay | Admitting: Gastroenterology

## 2023-06-24 VITALS — BP 134/82 | HR 81 | Temp 97.8°F | Ht 61.0 in | Wt 231.0 lb

## 2023-06-24 DIAGNOSIS — R1310 Dysphagia, unspecified: Secondary | ICD-10-CM

## 2023-06-24 DIAGNOSIS — Z86010 Personal history of colonic polyps: Secondary | ICD-10-CM

## 2023-06-24 NOTE — Patient Instructions (Signed)
Continue Nexium daily as needed.  Continue Colestid once daily for diarrhea. You will need to hold this for four days before your colonoscopy. Colonoscopy and upper endoscopy to be scheduled. I will have someone send you a mychart message with information you will need to contact your insurance regarding coverage.   It was a pleasure to see you today. I want to create trusting relationships with patients and provide genuine, compassionate, and quality care. I truly value your feedback, so please be on the lookout for a survey regarding your visit with me today. I appreciate your time in completing this!

## 2023-06-24 NOTE — Progress Notes (Signed)
GI Office Note    Referring Provider: Assunta Found, MD Primary Care Physician:  Assunta Found, MD  Primary Gastroenterologist: Hennie Duos. Marletta Lor, DO   Chief Complaint   Chief Complaint  Patient presents with   Follow-up    Here to schedule EGD    History of Present Illness    [name] is a 57 y.o. female presenting today for follow up. Last seen 02/2022. H/o GERD, chronic diarrhea, h/o adenomatous colon polyps.     Had planned for colon surveillance last year as well as EGD for dysphagia. She had to cancel her procedures due to cost. She presents today to get scheduled.   Continues to have issues after swallowing. Can happen with liquids or solids. Feels like something gets lodged, she has to wait for the feeling to pass. She is not sure if food is really getting stuck. When this happens she is able to breathe but is terrifies her. She has been seen by cardiology this year for SOB. She believes her symptoms are all due to her weight. Recent ECHO reassuring. Pending appt with pulmonology. She has sleep apnea and using CPAP every night.   Her heartburn is well controlled. Usually only take Nexium as needed, 1-2 times per week. No n/v. No abdominal pain. Diarrhea controlled with colestid. No melena or brbpr.     Colonoscopy 06/2019: -four 3-75mm polyps in descending colon, hepatic flexure removed -likely benign polypoid lesion at hepatic flexure s/p bx -moderate diverticulosis in recto-sigmoid colon, sigmoid colon and descending colon -ext/int hemorrhoids -tubular adenomas -next colonoscopy in 3 years.     EGD 01/2007:  FINAL DIAGNOSIS:  No evidence of Barrett's esophagus.  Small sliding  hiatal hernia.  Nonerosive antral gastritis.      Medications   Current Outpatient Medications  Medication Sig Dispense Refill   colestipol (COLESTID) 1 g tablet TAKE 1 TABLET(1 GRAM) BY MOUTH DAILY. DO NOT TAKE WITHIN 2 HOURS OF OTHER MEDICATIONS 90 tablet 3   esomeprazole  (NEXIUM) 20 MG capsule Take 20 mg by mouth daily at 2 PM. In the afternoon.     olmesartan (BENICAR) 40 MG tablet Take 40 mg by mouth daily.     No current facility-administered medications for this visit.    Allergies   Allergies as of 06/24/2023 - Review Complete 06/24/2023  Allergen Reaction Noted   Penicillins Shortness Of Breath      Past Medical History   Past Medical History:  Diagnosis Date   Anxiety    Cervical atypia, mild 12/2013   Sonohysterogram biopsy showed benign endometrium but inflamed mild atypia and squamous mucosa fragments recommend repeat Pap smear 6 months   GERD (gastroesophageal reflux disease)    HTN (hypertension)    Irritable bowel syndrome    Obesity    PONV (postoperative nausea and vomiting)    Sleep apnea    C PAP   Snoring    URI (upper respiratory infection)     Past Surgical History   Past Surgical History:  Procedure Laterality Date   BIOPSY  07/10/2019   Procedure: BIOPSY;  Surgeon: West Bali, MD;  [facility];  Service: Endoscopy;;   CERVICAL CONE BIOPSY  11/27/1987   CIN 1, ECC positive   CESAREAN SECTION  10/27/2003   CHOLECYSTECTOMY  11/26/2004   COLONOSCOPY  11/27/2007   Dr. Darrick Penna: frequent sigmoid colon diverticula, small internal hemrorhoids, colonic biopsies to assess for microscopic colitis, Benign    COLONOSCOPY N/A  07/10/2019   Diverticulosis, external/internal hemorrhoids, multiple tubular adenomas.  Next colonoscopy in 3 years.  Procedure: COLONOSCOPY;  Surgeon: West Bali, MD;  [facility];  Service: Endoscopy;  Laterality: N/A;  10:30am   ESOPHAGOGASTRODUODENOSCOPY  2008   Small sliding hiatal hernia, nonerosive antral gastritis, H. pylori serologies checked by Dr. Karilyn Cota, results unavailable.  No signs of Barrett's.   GYNECOLOGIC CRYOSURGERY     MAXILLARY ANTROSTOMY Left 05/14/2016   Procedure: LEFT ENDOSCOPIC MAXILLARY ANTROSTOMY WITH FUSION NAVIGATION;  Surgeon: Newman Pies, MD;   Location: [facility];  Service: ENT;  Laterality: Left;  LEFT ENDOSCOPIC MAXILLARY ANTROSTOMY WITH FUSION NAVIGATION   NASAL SEPTOPLASTY W/ TURBINOPLASTY Bilateral 05/14/2016   Procedure: NASAL SEPTOPLASTY WITH TURBINATE REDUCTION;  Surgeon: Newman Pies, MD;  Location: [facility];  Service: ENT;  Laterality: Bilateral;  NASAL SEPTOPLASTY WITH TURBINATE REDUCTION   POLYPECTOMY  07/10/2019   Procedure: POLYPECTOMY;  Surgeon: West Bali, MD;  [facility];  Service: Endoscopy;;   SINUS ENDO WITH FUSION Right 05/14/2016   Procedure: RIGHT ENDOSCOPIC  ETHMOIDECTOMY AND RIGHT ENDOSCOPIC FRONTAL SINUS EXPLORATION;  Surgeon: Newman Pies, MD;  Location: [facility];  Service: ENT;  Laterality: Right;  RIGHT ENDOSCOPIC  ETHMOIDECTOMY AND RIGHT ENDOSCOPIC FRONTAL SINUS EXPLORATION    Past Family History   Family History  Problem Relation Age of Onset   Hypertension Father    Heart attack Father    Hypertension Mother    Hypertension Sister    Breast cancer Sister 22   Cervical cancer Sister    Colon polyps Neg Hx    Colon cancer Neg Hx     Past Social History   Social History   Socioeconomic History   Marital status: Married    Spouse name: Not on file   Number of children: Not on file   Years of education: Not on file   Highest education level: Not on file  Occupational History   Occupation: Geologist, engineering    Employer: [employer]  Tobacco Use   Smoking status: Never   Smokeless tobacco: Never  Vaping Use   Vaping status: Never Used  Substance and Sexual Activity   Alcohol use: Yes    Alcohol/week: 0.0 standard drinks of alcohol    Comment: socially   Drug use: No   Sexual activity: Yes    Birth control/protection: Other-see comments    Comment: vasectomy-1st intercourse 18 yo-5 partners  Other Topics Concern   Not on file  Social History Narrative   Drinks 1 cup of coffee and 12 oz ginger ale.   Social  Determinants of Health   Financial Resource Strain: Low Risk  (06/06/2021)   Overall Financial Resource Strain (CARDIA)    Difficulty of Paying Living Expenses: Not hard at all  Food Insecurity: No Food Insecurity (06/06/2021)   Hunger Vital Sign    Worried About Running Out of Food in the Last Year: Never true    Ran Out of Food in the Last Year: Never true  Transportation Needs: No Transportation Needs (06/06/2021)   PRAPARE - Administrator, Civil Service (Medical): No    Lack of Transportation (Non-Medical): No  Physical Activity: Insufficiently Active (06/06/2021)   Exercise Vital Sign    Days of Exercise per Week: 2 days    Minutes of Exercise per Session: 20 min  Stress: No Stress Concern Present (06/06/2021)   [employer] of Occupational Health - Occupational Stress  Questionnaire    Feeling of Stress : Only a little  Social Connections: Socially Integrated (06/06/2021)   Social Connection and Isolation Panel [NHANES]    Frequency of Communication with Friends and Family: More than three times a week    Frequency of Social Gatherings with Friends and Family: Twice a week    Attends Religious Services: More than 4 times per year    Active Member of [employer] or Organizations: Yes    Attends Engineer, structural: More than 4 times per year    Marital Status: Married  Catering manager Violence: Not At Risk (06/06/2021)   Humiliation, Afraid, Rape, and Kick questionnaire    Fear of Current or Ex-Partner: No    Emotionally Abused: No    Physically Abused: No    Sexually Abused: No    Review of Systems   General: Negative for anorexia, weight loss, fever, chills, fatigue, weakness. ENT: Negative for hoarseness, difficulty swallowing , nasal congestion. CV: Negative for chest pain, angina, palpitations, dyspnea on exertion, peripheral edema.  Respiratory: Negative for  dyspnea on exertion, cough, sputum, wheezing. +SOB GI: See history of present illness. GU:   Negative for dysuria, hematuria, urinary incontinence, urinary frequency, nocturnal urination.  Endo: Negative for unusual weight change.     Physical Exam   BP 134/82 (BP Location: Right Arm, Patient Position: Sitting, Cuff Size: Large)   Pulse 81   Temp 97.8 F (36.6 C) (Oral)   Ht 5\' 1"  (1.549 m)   Wt 231 lb (104.8 kg)   LMP 12/05/2013   SpO2 96%   BMI 43.65 kg/m    General: Well-nourished, well-developed in no acute distress.  Eyes: No icterus. Mouth: Oropharyngeal mucosa moist and pink  Lungs: Clear to auscultation bilaterally.  Heart: Regular rate and rhythm, no murmurs rubs or gallops.  Abdomen: Bowel sounds are normal, nontender, nondistended, no hepatosplenomegaly or masses,  no abdominal bruits or hernia , no rebound or guarding.  Rectal: not performed Extremities: No lower extremity edema. No clubbing or deformities. Neuro: Alert and oriented x 4   Skin: Warm and dry, no jaundice.   Psych: Alert and cooperative, normal mood and affect.  Labs   Labs from March 2024: White blood cell count 10,400, hemoglobin 14.5, MCV 92, platelets 308,000, glucose 83, creatinine 0.81, albumin 4.2, total bilirubin 0.3, alkaline phosphatase 107, AST 22, ALT 30, TSH 2.110  Imaging Studies   No results found.  Assessment   *Dysphagia *H/O adenomatous colon polyps  Ongoing intermittent symptoms, occurring with liquids and solids, sensation of something lodged in chest after swallowing. Possibly esophageal stricture, spasm. Typical heartburn doing ok. Takes PPI prn only.   Due for surveillance colonoscopy.   PLAN   Colonoscopy/EGD/ED with Dr. Marletta Lor. ASA 3.  I have discussed the risks, alternatives, benefits with regards to but not limited to the risk of reaction to medication, bleeding, infection, perforation and the patient is agreeable to proceed. Written consent to be obtained. Continue nexium daily prn. Continue colestid once daily for diarrhea. Will hold prior to  colonoscopy prep.    Leanna Battles. Melvyn Neth, MHS, PA-C [facility]

## 2023-06-24 NOTE — Progress Notes (Deleted)
GI Office Note    Referring Provider: Assunta Found, MD Primary Care Physician:  Assunta Found, MD  Primary Gastroenterologist: Hennie Duos. Marletta Lor, DO   Chief Complaint   No chief complaint on file.    History of Present Illness   [name] is a 57 y.o. female presenting today for follow up. Last seen 02/2022. H/o GERD, chronic diarrhea, h/o adenomatous colon polyps. H/o diarrhea controlled with colestid.   Had planned for colon surveillance last year as well as EGD for dysphagia. She had to cancel her procedures. She presents today to get scheduled.   Colonoscopy 06/2019: -four 3-75mm polyps in descending colon, hepatic flexure removed -likely benign polypoid lesion at hepatic flexure s/p bx -moderate diverticulosis in recto-sigmoid colon, sigmoid colon and descending colon -ext/int hemorrhoids -tubular adenomas -next colonoscopy in 3 years.    EGD 01/2007:  FINAL DIAGNOSIS:  No evidence of Barrett's esophagus.  Small sliding  hiatal hernia.  Nonerosive antral gastritis.  Medications   Current Outpatient Medications  Medication Sig Dispense Refill   Ascorbic Acid (VITAMIN C ADULT GUMMIES PO) Take 2 each by mouth daily.     colestipol (COLESTID) 1 g tablet TAKE 1 TABLET(1 GRAM) BY MOUTH DAILY. DO NOT TAKE WITHIN 2 HOURS OF OTHER MEDICATIONS 90 tablet 3   esomeprazole (NEXIUM) 20 MG capsule Take 20 mg by mouth daily at 2 PM. In the afternoon.     furosemide (LASIX) 20 MG tablet Take 1 tablet (20 mg total) by mouth daily. 90 tablet 3   olmesartan (BENICAR) 40 MG tablet Take 40 mg by mouth daily.     No current facility-administered medications for this visit.    Allergies   Allergies as of 06/24/2023 - Review Complete 08/06/2022  Allergen Reaction Noted   Penicillins Shortness Of Breath     Past Medical History   Past Medical History:  Diagnosis Date   Anxiety    Cervical atypia, mild 12/2013   Sonohysterogram biopsy showed benign endometrium but  inflamed mild atypia and squamous mucosa fragments recommend repeat Pap smear 6 months   GERD (gastroesophageal reflux disease)    HTN (hypertension)    Irritable bowel syndrome    Obesity    PONV (postoperative nausea and vomiting)    Sleep apnea    C PAP   Snoring    URI (upper respiratory infection)     Past Surgical History   Past Surgical History:  Procedure Laterality Date   BIOPSY  07/10/2019   Procedure: BIOPSY;  Surgeon: West Bali, MD;  [facility];  Service: Endoscopy;;   CERVICAL CONE BIOPSY  11/27/1987   CIN 1, ECC positive   CESAREAN SECTION  10/27/2003   CHOLECYSTECTOMY  11/26/2004   COLONOSCOPY  11/27/2007   Dr. Darrick Penna: frequent sigmoid colon diverticula, small internal hemrorhoids, colonic biopsies to assess for microscopic colitis, Benign    COLONOSCOPY N/A 07/10/2019   Diverticulosis, external/internal hemorrhoids, multiple tubular adenomas.  Next colonoscopy in 3 years.  Procedure: COLONOSCOPY;  Surgeon: West Bali, MD;  [facility];  Service: Endoscopy;  Laterality: N/A;  10:30am   ESOPHAGOGASTRODUODENOSCOPY  2008   Small sliding hiatal hernia, nonerosive antral gastritis, H. pylori serologies checked by Dr. Karilyn Cota, results unavailable.  No signs of Barrett's.   GYNECOLOGIC CRYOSURGERY     MAXILLARY ANTROSTOMY Left 05/14/2016   Procedure: LEFT ENDOSCOPIC MAXILLARY ANTROSTOMY WITH FUSION NAVIGATION;  Surgeon: Newman Pies, MD;  Location: [facility];  Service:  ENT;  Laterality: Left;  LEFT ENDOSCOPIC MAXILLARY ANTROSTOMY WITH FUSION NAVIGATION   NASAL SEPTOPLASTY W/ TURBINOPLASTY Bilateral 05/14/2016   Procedure: NASAL SEPTOPLASTY WITH TURBINATE REDUCTION;  Surgeon: Newman Pies, MD;  Location: [facility];  Service: ENT;  Laterality: Bilateral;  NASAL SEPTOPLASTY WITH TURBINATE REDUCTION   POLYPECTOMY  07/10/2019   Procedure: POLYPECTOMY;  Surgeon: West Bali, MD;  [facility];  Service:  Endoscopy;;   SINUS ENDO WITH FUSION Right 05/14/2016   Procedure: RIGHT ENDOSCOPIC  ETHMOIDECTOMY AND RIGHT ENDOSCOPIC FRONTAL SINUS EXPLORATION;  Surgeon: Newman Pies, MD;  Location: [facility];  Service: ENT;  Laterality: Right;  RIGHT ENDOSCOPIC  ETHMOIDECTOMY AND RIGHT ENDOSCOPIC FRONTAL SINUS EXPLORATION    Past Family History   Family History  Problem Relation Age of Onset   Hypertension Father    Heart attack Father    Hypertension Mother    Hypertension Sister    Breast cancer Sister 52   Cervical cancer Sister    Colon polyps Neg Hx    Colon cancer Neg Hx     Past Social History   Social History   Socioeconomic History   Marital status: Married    Spouse name: Not on file   Number of children: Not on file   Years of education: Not on file   Highest education level: Not on file  Occupational History   Occupation: Geologist, engineering    Employer: [employer]  Tobacco Use   Smoking status: Never   Smokeless tobacco: Never  Vaping Use   Vaping status: Never Used  Substance and Sexual Activity   Alcohol use: Yes    Alcohol/week: 0.0 standard drinks of alcohol    Comment: socially   Drug use: No   Sexual activity: Yes    Birth control/protection: Other-see comments    Comment: vasectomy-1st intercourse 57 yo-5 partners  Other Topics Concern   Not on file  Social History Narrative   Drinks 1 cup of coffee and 12 oz ginger ale.   Social Determinants of Health   Financial Resource Strain: Low Risk  (06/06/2021)   Overall Financial Resource Strain (CARDIA)    Difficulty of Paying Living Expenses: Not hard at all  Food Insecurity: No Food Insecurity (06/06/2021)   Hunger Vital Sign    Worried About Running Out of Food in the Last Year: Never true    Ran Out of Food in the Last Year: Never true  Transportation Needs: No Transportation Needs (06/06/2021)   PRAPARE - Administrator, Civil Service (Medical): No    Lack of Transportation  (Non-Medical): No  Physical Activity: Insufficiently Active (06/06/2021)   Exercise Vital Sign    Days of Exercise per Week: 2 days    Minutes of Exercise per Session: 20 min  Stress: No Stress Concern Present (06/06/2021)   [employer] of Occupational Health - Occupational Stress Questionnaire    Feeling of Stress : Only a little  Social Connections: Socially Integrated (06/06/2021)   Social Connection and Isolation Panel [NHANES]    Frequency of Communication with Friends and Family: More than three times a week    Frequency of Social Gatherings with Friends and Family: Twice a week    Attends Religious Services: More than 4 times per year    Active Member of [employer] or Organizations: Yes    Attends Engineer, structural: More than 4 times per year    Marital Status: Married  Catering manager  Violence: Not At Risk (06/06/2021)   Humiliation, Afraid, Rape, and Kick questionnaire    Fear of Current or Ex-Partner: No    Emotionally Abused: No    Physically Abused: No    Sexually Abused: No    Review of Systems   General: Negative for anorexia, weight loss, fever, chills, fatigue, weakness. Eyes: Negative for vision changes.  ENT: Negative for hoarseness, difficulty swallowing , nasal congestion. CV: Negative for chest pain, angina, palpitations, dyspnea on exertion, peripheral edema.  Respiratory: Negative for dyspnea at rest, dyspnea on exertion, cough, sputum, wheezing.  GI: See history of present illness. GU:  Negative for dysuria, hematuria, urinary incontinence, urinary frequency, nocturnal urination.  MS: Negative for joint pain, low back pain.  Derm: Negative for rash or itching.  Neuro: Negative for weakness, abnormal sensation, seizure, frequent headaches, memory loss,  confusion.  Psych: Negative for anxiety, depression, suicidal ideation, hallucinations.  Endo: Negative for unusual weight change.  Heme: Negative for bruising or bleeding. Allergy: Negative for  rash or hives.  Physical Exam   LMP 12/05/2013    General: Well-nourished, well-developed in no acute distress.  Head: Normocephalic, atraumatic.   Eyes: Conjunctiva pink, no icterus. Mouth: Oropharyngeal mucosa moist and pink , no lesions erythema or exudate. Neck: Supple without thyromegaly, masses, or lymphadenopathy.  Lungs: Clear to auscultation bilaterally.  Heart: Regular rate and rhythm, no murmurs rubs or gallops.  Abdomen: Bowel sounds are normal, nontender, nondistended, no hepatosplenomegaly or masses,  no abdominal bruits or hernia, no rebound or guarding.   Rectal: *** Extremities: No lower extremity edema. No clubbing or deformities.  Neuro: Alert and oriented x 4 , grossly normal neurologically.  Skin: Warm and dry, no rash or jaundice.   Psych: Alert and cooperative, normal mood and affect.  Labs   *** Imaging Studies   No results found.  Assessment       PLAN   ***   Leanna Battles. Melvyn Neth, MHS, PA-C [facility]

## 2023-07-05 ENCOUNTER — Encounter: Payer: Self-pay | Admitting: *Deleted

## 2023-07-08 ENCOUNTER — Encounter: Payer: Self-pay | Admitting: *Deleted

## 2023-07-09 ENCOUNTER — Ambulatory Visit: Payer: BC Managed Care – PPO | Admitting: Internal Medicine

## 2023-07-12 ENCOUNTER — Ambulatory Visit: Payer: BC Managed Care – PPO | Admitting: Internal Medicine

## 2023-07-12 ENCOUNTER — Encounter: Payer: Self-pay | Admitting: Internal Medicine

## 2023-07-12 VITALS — BP 118/78 | HR 78 | Ht 61.0 in | Wt 232.0 lb

## 2023-07-12 DIAGNOSIS — G4733 Obstructive sleep apnea (adult) (pediatric): Secondary | ICD-10-CM | POA: Diagnosis not present

## 2023-07-12 DIAGNOSIS — R0602 Shortness of breath: Secondary | ICD-10-CM

## 2023-07-12 DIAGNOSIS — I10 Essential (primary) hypertension: Secondary | ICD-10-CM

## 2023-07-12 NOTE — Patient Instructions (Signed)
Medication Instructions:  Your physician recommends that you continue on your current medications as directed. Please refer to the Current Medication list given to you today.  *If you need a refill on your cardiac medications before your next appointment, please call your pharmacy*   Lab Work: None If you have labs (blood work) drawn today and your tests are completely normal, you will receive your results only by: MyChart Message (if you have MyChart) OR A paper copy in the mail If you have any lab test that is abnormal or we need to change your treatment, we will call you to review the results.   Testing/Procedures: None   Follow-Up: At [facility], you and your health needs are our priority.  As part of our continuing mission to provide you with exceptional heart care, we have created designated Provider Care Teams.  These Care Teams include your primary Cardiologist (physician) and Advanced Practice Providers (APPs -  Physician Assistants and Nurse Practitioners) who all work together to provide you with the care you need, when you need it.  We recommend signing up for the patient portal called "MyChart".  Sign up information is provided on this After Visit Summary.  MyChart is used to connect with patients for Virtual Visits (Telemedicine).  Patients are able to view lab/test results, encounter notes, upcoming appointments, etc.  Non-urgent messages can be sent to your provider as well.   To learn more about what you can do with MyChart, go to [email or web address].    Your next appointment:   Follow up as needed.    Provider:   Vishnu Mallipeddi, MD    Other Instructions    

## 2023-07-12 NOTE — Progress Notes (Signed)
Cardiology Office Note  Date: 07/12/2023   ID: [name], DOB [birth year], MRN [id number]  PCP:  Julia Found, MD  Cardiologist:  Julia Bicker, MD Electrophysiologist:  None    History of Present Illness: [name] is a 57 y.o. female known to have HTN, OSA on CPAP is here for follow-up visit.  Patient was referred to cardiology clinic for SOB for few months, occurs especially at rest and not with exertion. Echo showed normal LVEF, normal diastology and no valvular heart disease.  She was given a trial of p.o. Lasix 20 mg to see if her SOB resolved but it did not work.  She stopped taking it per instructions.  Denies having any other symptoms of angina, dizziness, presyncope, syncope, palpitations, orthopnea, PND.  She continues to have SOB at rest and not with exertion.  Uses CPAP at night..  Past Medical History:  Diagnosis Date   Anxiety    Cervical atypia, mild 12/2013   Sonohysterogram biopsy showed benign endometrium but inflamed mild atypia and squamous mucosa fragments recommend repeat Pap smear 6 months   GERD (gastroesophageal reflux disease)    HTN (hypertension)    Irritable bowel syndrome    Obesity    PONV (postoperative nausea and vomiting)    Sleep apnea    C PAP   Snoring    URI (upper respiratory infection)     Past Surgical History:  Procedure Laterality Date   BIOPSY  07/10/2019   Procedure: BIOPSY;  Surgeon: West Bali, MD;  [facility];  Service: Endoscopy;;   CERVICAL CONE BIOPSY  11/27/1987   CIN 1, ECC positive   CESAREAN SECTION  10/27/2003   CHOLECYSTECTOMY  11/26/2004   COLONOSCOPY  11/27/2007   Dr. Darrick Donaldson: frequent sigmoid colon diverticula, small internal hemrorhoids, colonic biopsies to assess for microscopic colitis, Benign    COLONOSCOPY N/A 07/10/2019   Diverticulosis, external/internal hemorrhoids, multiple tubular adenomas.  Next colonoscopy in 3 years.  Procedure: COLONOSCOPY;  Surgeon: West Bali, MD;  [facility];  Service: Endoscopy;  Laterality: N/A;  10:30am   ESOPHAGOGASTRODUODENOSCOPY  2008   Small sliding hiatal hernia, nonerosive antral gastritis, H. pylori serologies checked by Dr. Karilyn Donaldson, results unavailable.  No signs of Barrett's.   GYNECOLOGIC CRYOSURGERY     MAXILLARY ANTROSTOMY Left 05/14/2016   Procedure: LEFT ENDOSCOPIC MAXILLARY ANTROSTOMY WITH FUSION NAVIGATION;  Surgeon: Julia Pies, MD;  Location: [facility];  Service: ENT;  Laterality: Left;  LEFT ENDOSCOPIC MAXILLARY ANTROSTOMY WITH FUSION NAVIGATION   NASAL SEPTOPLASTY W/ TURBINOPLASTY Bilateral 05/14/2016   Procedure: NASAL SEPTOPLASTY WITH TURBINATE REDUCTION;  Surgeon: Julia Pies, MD;  Location: [facility];  Service: ENT;  Laterality: Bilateral;  NASAL SEPTOPLASTY WITH TURBINATE REDUCTION   POLYPECTOMY  07/10/2019   Procedure: POLYPECTOMY;  Surgeon: West Bali, MD;  [facility];  Service: Endoscopy;;   SINUS ENDO WITH FUSION Right 05/14/2016   Procedure: RIGHT ENDOSCOPIC  ETHMOIDECTOMY AND RIGHT ENDOSCOPIC FRONTAL SINUS EXPLORATION;  Surgeon: Julia Pies, MD;  Location: [facility];  Service: ENT;  Laterality: Right;  RIGHT ENDOSCOPIC  ETHMOIDECTOMY AND RIGHT ENDOSCOPIC FRONTAL SINUS EXPLORATION    Current Outpatient Medications  Medication Sig Dispense Refill   colestipol (COLESTID) 1 g tablet TAKE 1 TABLET(1 GRAM) BY MOUTH DAILY. DO NOT TAKE WITHIN 2 HOURS OF OTHER MEDICATIONS 90 tablet 3   esomeprazole (NEXIUM) 20 MG capsule Take 20 mg by mouth daily at  2 PM. In the afternoon.     olmesartan (BENICAR) 40 MG tablet Take 40 mg by mouth daily.     No current facility-administered medications for this visit.   Allergies:  Penicillins   Social History: The patient  reports that she has never smoked. She has never used smokeless tobacco. She reports current alcohol use. She reports that she does not use drugs.   Family History: The patient's  family history includes Breast cancer (age of onset: 70) in her sister; Cervical cancer in her sister; Heart attack in her father; Hypertension in her father, mother, and sister.   ROS:  Please see the history of present illness. Otherwise, complete review of systems is positive for none.  All other systems are reviewed and negative.   Physical Exam: VS:  LMP 12/05/2013 , BMI There is no height or weight on file to calculate BMI.  Wt Readings from Last 3 Encounters:  06/24/23 231 lb (104.8 kg)  04/08/23 228 lb 9.6 oz (103.7 kg)  06/20/22 235 lb (106.6 kg)    General: Patient appears comfortable at rest. HEENT: Conjunctiva and lids normal, oropharynx clear with moist mucosa. Neck: Supple, no elevated JVP or carotid bruits, no thyromegaly. Lungs: Clear to auscultation, nonlabored breathing at rest. Cardiac: Regular rate and rhythm, no S3 or significant systolic murmur, no pericardial rub. Abdomen: Soft, nontender, no hepatomegaly, bowel sounds present, no guarding or rebound. Extremities: No pitting edema, distal pulses 2+. Skin: Warm and dry. Musculoskeletal: No kyphosis. Neuropsychiatric: Alert and oriented x3, affect grossly appropriate.  Recent Labwork: 04/08/2023: B Natriuretic Peptide 21.0     Component Value Date/Time   CHOL 156 12/03/2013 1646   TRIG 179 (H) 12/03/2013 1646   HDL 39 (L) 12/03/2013 1646   CHOLHDL 4.0 12/03/2013 1646   VLDL 36 12/03/2013 1646   LDLCALC 81 12/03/2013 1646     Assessment and Plan: Patient is a 57 year old F known to have HTN, OSA on CPAP was referred to cardiology clinic for evaluation of SOB.  SOB : Patient has SOB at rest and not with exertion. Echo showed normal LVEF, normal diastology and no valve problems. Unlikely SOB is cardiac in nature. Follow-up with PCP for further evaluation.   HTN, controlled: Continue olmesartan 40 mg once daily.  OSA on CPAP: Follow-up with PCP/sleep medicine for CPAP management.      Medication  Adjustments/Labs and Tests Ordered: Current medicines are reviewed at length with the patient today.  Concerns regarding medicines are outlined above.   Tests Ordered: No orders of the defined types were placed in this encounter.   Medication Changes: No orders of the defined types were placed in this encounter.   Disposition:  Follow up PRN  Signed, Roxanna Mcever Verne Spurr, MD, 07/12/2023 9:14 AM    [facility] [address], [place], [place] [address]

## 2023-07-15 NOTE — Progress Notes (Unsigned)
[name], female    DOB: 1966/06/24    MRN: [id number]   Brief patient profile:  35  yowf  never smoker /never allergies  referred to pulmonary clinic in [place]  07/16/2023 by Dr [name]  for doe.     Age 57 s/p last IUP with gestational dm complication with baseline wtaround 175 and progressive wt gain to around 230 x 2018 and onset doe x winter of 2024      History of Present Illness  07/16/2023  Pulmonary/ 1st office eval/ [name] / [name] Office  Chief Complaint  Patient presents with   Establish Care   Shortness of Breath  Dyspnea:   baseline walking 45 min with no problem and "breathing hard after 30  min"  noted p trip April to [place] > no better p cards eval added diuretic (for mild LAE on echo)  Cough: none Sleep: bed is flat / left side down  most night immediately on lying down but does not wake her on new  cpap  lasts usually and lasts 5 min some times has to get up but usually able to get to sleep  Also also food is sticking > mid sternum helps to burp - only takes ppi prn  SABA use: none  02: none   No obvious day to day or daytime pattern/variability or assoc excess/ purulent sputum or mucus plugs or hemoptysis or cp or chest tightness, subjective wheeze or overt sinus   symptoms.    Also denies any obvious fluctuation of symptoms with weather or environmental changes or other aggravating or alleviating factors except as outlined above   No unusual exposure hx or h/o childhood pna/ asthma or knowledge of premature birth.  Current Allergies, Complete Past Medical History, Past Surgical History, Family History, and Social History were reviewed in [employer] record.  ROS  The following are not active complaints unless bolded Hoarseness, sore throat, dysphagia/globus sensation , dental problems, itching, sneezing,  nasal congestion or discharge of excess mucus or purulent secretions, ear ache,   fever, chills, sweats, unintended wt  loss or wt gain, classically pleuritic or exertional cp,  orthopnea pnd or arm/hand swelling  or leg swelling, presyncope, palpitations, abdominal pain, anorexia, nausea, vomiting, diarrhea  or change in bowel habits or change in bladder habits, change in stools or change in urine, dysuria, hematuria,  rash, arthralgias, visual complaints, headache, numbness, weakness or ataxia or problems with walking or coordination,  change in mood or  memory.            Outpatient Medications Prior to Visit  Medication Sig Dispense Refill   colestipol (COLESTID) 1 g tablet TAKE 1 TABLET(1 GRAM) BY MOUTH DAILY. DO NOT TAKE WITHIN 2 HOURS OF OTHER MEDICATIONS 90 tablet 3   esomeprazole (NEXIUM) 20 MG capsule Takes prn Heart burn     olmesartan (BENICAR) 40 MG tablet Take 40 mg by mouth daily.     furosemide (LASIX) 20 MG tablet Take 20 mg by mouth daily.     No facility-administered medications prior to visit.    Past Medical History:  Diagnosis Date   Anxiety    Cervical atypia, mild 12/2013   Sonohysterogram biopsy showed benign endometrium but inflamed mild atypia and squamous mucosa fragments recommend repeat Pap smear 6 months   GERD (gastroesophageal reflux disease)    HTN (hypertension)    Irritable bowel syndrome    Obesity    PONV (postoperative nausea and vomiting)  Sleep apnea    C PAP   Snoring    URI (upper respiratory infection)       Objective:     BP 134/85   Pulse 85   Ht 5\' 1"  (1.549 m)   Wt 232 lb (105.2 kg)   LMP 12/05/2013   SpO2 95%   BMI 43.84 kg/m   SpO2: 95 % pleasant amb wf MO (by BMI)  with poor insp excursion   HEENT : Oropharynx  clear         NECK :  without  apparent JVD/ palpable Nodes/TM    LUNGS: no acc muscle use,  Nl contour chest which is clear to A and P bilaterally without cough on insp or exp maneuvers   CV:  RRR  no s3 or murmur or increase in P2, and no edema   ABD:  soft and nontender with poor inspiratory excursion in the  semi-supine position.    MS:  Nl gait/ ext warm without deformities Or obvious joint restrictions  calf tenderness, cyanosis or clubbing    SKIN: warm and dry without lesions    NEURO:  alert, approp, nl sensorium with  no motor or cerebellar deficits apparent.       I personally reviewed images and agree with radiology impression as follows:   PA and cxr  02/06/23  Wnl   BNP  04/08/23  = 21    Assessment   DOE (dyspnea on exertion) Onset winter 2024 with assoc new onset dysphagia and neg cards w/u - Echo 04/29/23 nl x for mild LAE  - 07/16/2023   @ wt 232 Walked on  RA x  3  lap(s) =  approx 450  ft  @ brisk pace, stopped due to end of study  with lowest 02 sats 92% and no doe   She actually has 3 different patterns of sob one reproducible at high levels of exertion likely related to MO and deconditioning , one assoc with dysphagia which is likely related to GERD, and the 3rd immediately on lyng down and assoc with poor insp abd excursion on exam likely due to diaphragm fatigue with perhaps incipient OHS suggested   Rec:  Rec sub max ex/ monitor sats at peak ex and f/u with pfts p gi eval complete if not making progress   Morbid obesity due to excess calories (HCC) Body mass index is 43.84 kg/m.   Lab Results  Component Value Date   TSH 1.991 12/03/2013      Contributing to doe and risk of GERD/dvt/PE  >>>   reviewed the need and the process to achieve and maintain neg calorie balance > defer f/u primary care including intermittently monitoring thyroid status     Each maintenance medication was reviewed in detail including emphasizing most importantly the difference between maintenance and prns and under what circumstances the prns are to be triggered using an action plan format where appropriate.  Total time for H and P, chart review, counseling,  directly observing portions of ambulatory 02 saturation study/ and generating customized AVS unique to this office visit / same day  charting = 60 min new pt eval                    Julia Hughs, MD 07/16/2023

## 2023-07-16 ENCOUNTER — Encounter: Payer: Self-pay | Admitting: Internal Medicine

## 2023-07-16 ENCOUNTER — Ambulatory Visit: Payer: BC Managed Care – PPO | Admitting: Internal Medicine

## 2023-07-16 VITALS — BP 134/85 | HR 85 | Ht 61.0 in | Wt 232.0 lb

## 2023-07-16 DIAGNOSIS — R0609 Other forms of dyspnea: Secondary | ICD-10-CM | POA: Diagnosis not present

## 2023-07-16 NOTE — Assessment & Plan Note (Signed)
Onset winter 2024 with assoc new onset dysphagia and neg cards w/u - Echo 04/29/23 nl x for mild LAE  - 07/16/2023   @ wt 232 Walked on  RA x  3  lap(s) =  approx 450  ft  @ brisk pace, stopped due to end of study  with lowest 02 sats 92% and no doe   She actually has 3 different patterns of sob one reproducible at high levels of exertion likely related to MO and deconditioning , one assoc with dysphagia which is likely related to GERD, and the 3rd immediately on lyng down and assoc with poor insp abd excursion on exam likely due to diaphragm fatigue with perhaps incipient OHS suggested   Rec:  Rec sub max ex/ monitor sats at peak ex and f/u with pfts p gi eval complete if not making progress

## 2023-07-16 NOTE — Patient Instructions (Addendum)
To get the most out of exercise, you need to be continuously aware that you are short of breath, but never out of breath, for at least 30 minutes daily. As you improve, it will actually be easier for you to do the same amount of exercise  in  30 minutes so always push to the level where you are short of breath.     Make sure you check your oxygen saturation at your highest level of activity(NOT after you stop)  to be sure it stays over 90% and keep track of it at least once a week, more often if breathing getting worse, and let me know if losing ground. (Collect the dots to connect the dots approach)     GERD (REFLUX)  is an extremely common cause of respiratory symptoms just like yours , many times with no obvious heartburn at all.    It can be treated with medication, but also with lifestyle changes including elevation of the head of your bed (ideally with 6 -8inch blocks under the headboard of your bed),  Smoking cessation, avoidance of late meals, excessive alcohol, and avoid fatty foods, chocolate, peppermint, colas, red wine, and acidic juices such as orange juice.  NO MINT OR MENTHOL PRODUCTS SO NO COUGH DROPS  USE SUGARLESS CANDY INSTEAD (Jolley ranchers or Stover's or Life Savers) or even ice chips will also do - the key is to swallow to prevent all throat clearing. NO OIL BASED VITAMINS - use powdered substitutes.  Avoid fish oil     Omeprazole or Nexium Take 30- 60 min before your first and last meals of the day   If not making progress we can see you back here with PFTs next step

## 2023-07-16 NOTE — Assessment & Plan Note (Signed)
Body mass index is 43.84 kg/m.   Lab Results  Component Value Date   TSH 1.991 12/03/2013      Contributing to doe and risk of GERD/dvt/PE  >>>   reviewed the need and the process to achieve and maintain neg calorie balance > defer f/u primary care including intermittently monitoring thyroid status     Each maintenance medication was reviewed in detail including emphasizing most importantly the difference between maintenance and prns and under what circumstances the prns are to be triggered using an action plan format where appropriate.  Total time for H and P, chart review, counseling,  directly observing portions of ambulatory 02 saturation study/ and generating customized AVS unique to this office visit / same day charting = 60 min new pt eval

## 2023-08-13 NOTE — Patient Instructions (Signed)
[name]  08/13/2023     @PREFPERIOPPHARMACY @   Your procedure is scheduled on 08/16/2023.  Report to [facility] at 6:00 A.M.  Call this number if you have problems the morning of surgery:  [phone]  If you experience any cold or flu symptoms such as cough, fever, chills, shortness of breath, etc. between now and your scheduled surgery, please notify us at the above number.   Remember:   Please follow the diet and prep instructions given to you by Dr Queen Bloss[facility] office.      Take these medicines the morning of surgery with A SIP OF WATER : Nexium    Do not wear jewelry, make-up or nail polish, including gel polish,  artificial nails, or any other type of covering on natural nails (fingers and  toes).  Do not wear lotions, powders, or perfumes, or deodorant.  Do not shave 48 hours prior to surgery.  Men may shave face and neck.  Do not bring valuables to the hospital.  [facility] is not responsible for any belongings or valuables.  Contacts, dentures or bridgework may not be worn into surgery.  Leave your suitcase in the car.  After surgery it may be brought to your room.  For patients admitted to the hospital, discharge time will be determined by your treatment team.  Patients discharged the day of surgery will not be allowed to drive home.   Name and phone number of your driver:   Family Special instructions:  N/A  Please read over the following fact sheets that you were given. Care and Recovery After Surgery  Colonoscopy, Adult A colonoscopy is a procedure to look at the entire large intestine. This procedure is done using a long, thin, flexible tube that has a camera on the end. You may have a colonoscopy: As a part of normal colorectal screening. If you have certain symptoms, such as: A low number of red blood cells in your blood (anemia). Diarrhea that does not go away. Pain in your abdomen. Blood in your stool. A colonoscopy can help screen for and  diagnose medical problems, including: An abnormal growth of cells or tissue (tumor). Abnormal growths within the lining of your intestine (polyps). Inflammation. Areas of bleeding. Tell your health care provider about: Any allergies you have. All medicines you are taking, including vitamins, herbs, eye drops, creams, and over-the-counter medicines. Any problems you or family members have had with anesthetic medicines. Any bleeding problems you have. Any surgeries you have had. Any medical conditions you have. Any problems you have had with having bowel movements. Whether you are pregnant or may be pregnant. What are the risks? Generally, this is a safe procedure. However, problems may occur, including: Bleeding. Damage to your intestine. Allergic reactions to medicines given during the procedure. Infection. This is rare. What happens before the procedure? Eating and drinking restrictions Follow instructions from your health care provider about eating or drinking restrictions, which may include: A few days before the procedure: Follow a low-fiber diet. Avoid nuts, seeds, dried fruit, raw fruits, and vegetables. 1-3 days before the procedure: Eat only gelatin dessert or ice pops. Drink only clear liquids, such as water, clear juice, clear broth or bouillon, black coffee or tea, or clear soft drinks or sports drinks. Avoid liquids that contain red or purple dye. The day of the procedure: Do not eat solid foods. You may continue to drink clear liquids until up to 2 hours before the procedure. Do not eat  or drink anything starting 2 hours before the procedure, or within the time period that your health care provider recommends. Bowel prep If you were prescribed a bowel prep to take by mouth (orally) to clean out your colon: Take it as told by your health care provider. Starting the day before your procedure, you will need to drink a large amount of liquid medicine. The liquid will cause  you to have many bowel movements of loose stool until your stool becomes almost clear or light green. If your skin or the opening between the buttocks (anus) gets irritated from diarrhea, you may relieve the irritation using: Wipes with medicine in them, such as adult wet wipes with aloe and vitamin E. A product to soothe skin, such as petroleum jelly. If you vomit while drinking the bowel prep: Take a break for up to 60 minutes. Begin the bowel prep again. Call your health care provider if you keep vomiting or you cannot take the bowel prep without vomiting. To clean out your colon, you may also be given: Laxative medicines. These help you have a bowel movement. Instructions for enema use. An enema is liquid medicine injected into your rectum. Medicines Ask your health care provider about: Changing or stopping your regular medicines or supplements. This is especially important if you are taking iron supplements, diabetes medicines, or blood thinners. Taking medicines such as aspirin and ibuprofen. These medicines can thin your blood. Do not take these medicines unless your health care provider tells you to take them. Taking over-the-counter medicines, vitamins, herbs, and supplements. General instructions Ask your health care provider what steps will be taken to help prevent infection. These may include washing skin with a germ-killing soap. If you will be going home right after the procedure, plan to have a responsible adult: Take you home from the hospital or clinic. You will not be allowed to drive. Care for you for the time you are told. What happens during the procedure?  An IV will be inserted into one of your veins. You will be given a medicine to make you fall asleep (general anesthetic). You will lie on your side with your knees bent. A lubricant will be put on the tube. Then the tube will be: Inserted into your anus. Gently eased through all parts of your large intestine. Air  will be sent into your colon to keep it open. This may cause some pressure or cramping. Images will be taken with the camera and will appear on a screen. A small tissue sample may be removed to be looked at under a microscope (biopsy). The tissue may be sent to a lab for testing if any signs of problems are found. If small polyps are found, they may be removed and checked for cancer cells. When the procedure is finished, the tube will be removed. The procedure may vary among health care providers and hospitals. What happens after the procedure? Your blood pressure, heart rate, breathing rate, and blood oxygen level will be monitored until you leave the hospital or clinic. You may have a small amount of blood in your stool. You may pass gas and have mild cramping or bloating in your abdomen. This is caused by the air that was used to open your colon during the exam. If you were given a sedative during the procedure, it can affect you for several hours. Do not drive or operate machinery until your health care provider says that it is safe. It is up to you to  get the results of your procedure. Ask your health care provider, or the department that is doing the procedure, when your results will be ready. Summary A colonoscopy is a procedure to look at the entire large intestine. Follow instructions from your health care provider about eating and drinking before the procedure. If you were prescribed an oral bowel prep to clean out your colon, take it as told by your health care provider. During the colonoscopy, a flexible tube with a camera on its end is inserted into the anus and then passed into all parts of the large intestine. This information is not intended to replace advice given to you by your health care provider. Make sure you discuss any questions you have with your health care provider. Document Revised: 12/25/2022 Document Reviewed: 07/05/2021 Elsevier Patient Education  2024 [employer]  Upper Endoscopy, Adult Upper endoscopy is a procedure to look inside the upper GI (gastrointestinal) tract. The upper GI tract is made up of: The esophagus. This is the part of the body that moves food from your mouth to your stomach. The stomach. The duodenum. This is the first part of your small intestine. This procedure is also called esophagogastroduodenoscopy (EGD) or gastroscopy. In this procedure, your health care provider passes a thin, flexible tube (endoscope) through your mouth and down your esophagus into your stomach and into your duodenum. A small camera is attached to the end of the tube. Images from the camera appear on a monitor in the exam room. During this procedure, your health care provider may also remove a small piece of tissue to be sent to a lab and examined under a microscope (biopsy). Your health care provider may do an upper endoscopy to diagnose cancers of the upper GI tract. You may also have this procedure to find the cause of other conditions, such as: Stomach pain. Heartburn. Pain or problems when swallowing. Nausea and vomiting. Stomach bleeding. Stomach ulcers. Tell a health care provider about: Any allergies you have. All medicines you are taking, including vitamins, herbs, eye drops, creams, and over-the-counter medicines. Any problems you or family members have had with anesthetic medicines. Any bleeding problems you have. Any surgeries you have had. Any medical conditions you have. Whether you are pregnant or may be pregnant. What are the risks? Your healthcare provider will talk with you about risks. These may include: Infection. Bleeding. Allergic reactions to medicines. A tear or hole (perforation) in the esophagus, stomach, or duodenum. What happens before the procedure? When to stop eating and drinking Follow instructions from your health care provider about what you may eat and drink. These may include: 8 hours before your  procedure Stop eating most foods. Do not eat meat, fried foods, or fatty foods. Eat only light foods, such as toast or crackers. All liquids are okay except energy drinks and alcohol. 6 hours before your procedure Stop eating. Drink only clear liquids, such as water, clear fruit juice, black coffee, plain tea, and sports drinks. Do not drink energy drinks or alcohol. 2 hours before your procedure Stop drinking all liquids. You may be allowed to take medicines with small sips of water. If you do not follow your health care provider's instructions, your procedure may be delayed or canceled. Medicines Ask your health care provider about: Changing or stopping your regular medicines. This is especially important if you are taking diabetes medicines or blood thinners. Taking medicines such as aspirin and ibuprofen. These medicines can thin your blood. Do not take  these medicines unless your health care provider tells you to take them. Taking over-the-counter medicines, vitamins, herbs, and supplements. General instructions If you will be going home right after the procedure, plan to have a responsible adult: Take you home from the hospital or clinic. You will not be allowed to drive. Care for you for the time you are told. What happens during the procedure?  An IV will be inserted into one of your veins. You may be given one or more of the following: A medicine to help you relax (sedative). A medicine to numb the throat (local anesthetic). You will lie on your left side on an exam table. Your health care provider will pass the endoscope through your mouth and down your esophagus. Your health care provider will use the scope to check the inside of your esophagus, stomach, and duodenum. Biopsies may be taken. The endoscope will be removed. The procedure may vary among health care providers and hospitals. What happens after the procedure? Your blood pressure, heart rate, breathing rate, and  blood oxygen level will be monitored until you leave the hospital or clinic. When your throat is no longer numb, you may be given some fluids to drink. If you were given a sedative during the procedure, it can affect you for several hours. Do not drive or operate machinery until your health care provider says that it is safe. It is up to you to get the results of your procedure. Ask your health care provider, or the department that is doing the procedure, when your results will be ready. Contact a health care provider if you: Have a sore throat that lasts longer than 1 day. Have a fever. Get help right away if you: Vomit blood or your vomit looks like coffee grounds. Have bloody, black, or tarry stools. Have a very bad sore throat or you cannot swallow. Have difficulty breathing or very bad pain in your chest or abdomen. These symptoms may be an emergency. Get help right away. Call 911. Do not wait to see if the symptoms will go away. Do not drive yourself to the hospital. Summary Upper endoscopy is a procedure to look inside the upper GI tract. During the procedure, an IV will be inserted into one of your veins. You may be given a medicine to help you relax. The endoscope will be passed through your mouth and down your esophagus. Follow instructions from your health care provider about what you can eat and drink. This information is not intended to replace advice given to you by your health care provider. Make sure you discuss any questions you have with your health care provider. Document Revised: 02/21/2022 Document Reviewed: 02/21/2022 Elsevier Patient Education  2024 [employer]  Esophageal Dilatation Esophageal dilatation, also called esophageal dilation, is a procedure to widen or open a blocked or narrowed part of the esophagus. The esophagus is the part of the body that moves food and liquid from the mouth to the stomach. You may need this procedure if: You have a buildup of  scar tissue in your esophagus that makes it difficult, painful, or impossible to swallow. This can be caused by gastroesophageal reflux disease (GERD). You have cancer of the esophagus. There is a problem with how food moves through your esophagus. In some cases, you may need this procedure repeated at a later time to dilate the esophagus gradually. Tell a health care provider about: Any allergies you have. All medicines you are taking, including vitamins, herbs, eye drops,  creams, and over-the-counter medicines. Any problems you or family members have had with anesthetic medicines. Any blood disorders you have. Any surgeries you have had. Any medical conditions you have. Any antibiotic medicines you are required to take before dental procedures. Whether you are pregnant or may be pregnant. What are the risks? Generally, this is a safe procedure. However, problems may occur, including: Bleeding due to a tear in the lining of the esophagus. A hole, or perforation, in the esophagus. What happens before the procedure? Ask your health care provider about: Changing or stopping your regular medicines. This is especially important if you are taking diabetes medicines or blood thinners. Taking medicines such as aspirin and ibuprofen. These medicines can thin your blood. Do not take these medicines unless your health care provider tells you to take them. Taking over-the-counter medicines, vitamins, herbs, and supplements. Follow instructions from your health care provider about eating or drinking restrictions. Plan to have a responsible adult take you home from the hospital or clinic. Plan to have a responsible adult care for you for the time you are told after you leave the hospital or clinic. This is important. What happens during the procedure? You may be given a medicine to help you relax (sedative). A numbing medicine may be sprayed into the back of your throat, or you may gargle the  medicine. Your health care provider may perform the dilatation using various surgical instruments, such as: Simple dilators. This instrument is carefully placed in the esophagus to stretch it. Guided wire bougies. This involves using an endoscope to insert a wire into the esophagus. A dilator is passed over this wire to enlarge the esophagus. Then the wire is removed. Balloon dilators. An endoscope with a small balloon is inserted into the esophagus. The balloon is inflated to stretch the esophagus and open it up. The procedure may vary among health care providers and hospitals. What can I expect after the procedure? Your blood pressure, heart rate, breathing rate, and blood oxygen level will be monitored until you leave the hospital or clinic. Your throat may feel slightly sore and numb. This will get better over time. You will not be allowed to eat or drink until your throat is no longer numb. When you are able to drink, urinate, and sit on the edge of the bed without nausea or dizziness, you may be able to return home. Follow these instructions at home: Take over-the-counter and prescription medicines only as told by your health care provider. If you were given a sedative during the procedure, it can affect you for several hours. Do not drive or operate machinery until your health care provider says that it is safe. Plan to have a responsible adult care for you for the time you are told. This is important. Follow instructions from your health care provider about any eating or drinking restrictions. Do not use any products that contain nicotine or tobacco, such as cigarettes, e-cigarettes, and chewing tobacco. If you need help quitting, ask your health care provider. Keep all follow-up visits. This is important. Contact a health care provider if: You have a fever. You have pain that is not relieved by medicine. Get help right away if: You have chest pain. You have trouble breathing. You  have trouble swallowing. You vomit blood. You have black, tarry, or bloody stools. These symptoms may represent a serious problem that is an emergency. Do not wait to see if the symptoms will go away. Get medical help right away. Call  your local emergency services (911 in the [place] Do not drive yourself to the hospital. Summary Esophageal dilatation, also called esophageal dilation, is a procedure to widen or open a blocked or narrowed part of the esophagus. Plan to have a responsible adult take you home from the hospital or clinic. For this procedure, a numbing medicine may be sprayed into the back of your throat, or you may gargle the medicine. Do not drive or operate machinery until your health care provider says that it is safe. This information is not intended to replace advice given to you by your health care provider. Make sure you discuss any questions you have with your health care provider. Document Revised: 03/30/2020 Document Reviewed: 03/30/2020 Elsevier Patient Education  2024 [employer]  Monitored Anesthesia Care Anesthesia refers to the techniques, procedures, and medicines that help a person stay safe and comfortable during surgery. Monitored anesthesia care, or sedation, is one type of anesthesia. You may have sedation if you do not need to be asleep for your procedure. Procedures that use sedation may include: Surgery to remove cataracts from your eyes. A dental procedure. A biopsy. This is when a tissue sample is removed and looked at under a microscope. You will be watched closely during your procedure. Your level of sedation or type of anesthesia may be changed to fit your needs. Tell a health care provider about: Any allergies you have. All medicines you are taking, including vitamins, herbs, eye drops, creams, and over-the-counter medicines. Any problems you or family members have had with anesthesia. Any bleeding problems you have. Any surgeries you have  had. Any medical conditions or illnesses you have. This includes sleep apnea, cough, fever, or the flu. Whether you are pregnant or may be pregnant. Whether you use cigarettes, alcohol, or drugs. Any use of steroids, whether by mouth or as a cream. What are the risks? Your health care provider will talk with you about risks. These may include: Getting too much medicine (oversedation). Nausea. Allergic reactions to medicines. Trouble breathing. If this happens, a breathing tube may be used to help you breathe. It will be removed when you are awake and breathing on your own. Heart trouble. Lung trouble. Confusion that gets better with time (emergence delirium). What happens before the procedure? When to stop eating and drinking Follow instructions from your health care provider about what you may eat and drink. These may include: 8 hours before your procedure Stop eating most foods. Do not eat meat, fried foods, or fatty foods. Eat only light foods, such as toast or crackers. All liquids are okay except energy drinks and alcohol. 6 hours before your procedure Stop eating. Drink only clear liquids, such as water, clear fruit juice, black coffee, plain tea, and sports drinks. Do not drink energy drinks or alcohol. 2 hours before your procedure Stop drinking all liquids. You may be allowed to take medicines with small sips of water. If you do not follow your health care provider's instructions, your procedure may be delayed or canceled. Medicines Ask your health care provider about: Changing or stopping your regular medicines. These include any diabetes medicines or blood thinners you take. Taking medicines such as aspirin and ibuprofen. These medicines can thin your blood. Do not take them unless your health care provider tells you to. Taking over-the-counter medicines, vitamins, herbs, and supplements. Testing You may have an exam or testing. You may have a blood or urine sample  taken. General instructions Do not use any products that  contain nicotine or tobacco for at least 4 weeks before the procedure. These products include cigarettes, chewing tobacco, and vaping devices, such as e-cigarettes. If you need help quitting, ask your health care provider. If you will be going home right after the procedure, plan to have a responsible adult: Take you home from the hospital or clinic. You will not be allowed to drive. Care for you for the time you are told. What happens during the procedure?  Your blood pressure, heart rate, breathing, level of pain, and blood oxygen level will be monitored. An IV will be inserted into one of your veins. You may be given: A sedative. This helps you relax. Anesthesia. This will: Numb certain areas of your body. Make you fall asleep for surgery. You will be given medicines as needed to keep you comfortable. The more medicine you are given, the deeper your level of sedation will be. Your level of sedation may be changed to fit your needs. There are three levels of sedation: Mild sedation. At this level, you may feel awake and relaxed. You will be able to follow directions. Moderate sedation. At this level, you will be sleepy. You may not remember the procedure. Deep sedation. At this level, you will be asleep. You will not remember the procedure. How you get the medicines will depend on your age and the procedure. They may be given as: A pill. This may be taken by mouth (orally) or inserted into the rectum. An injection. This may be into a vein or muscle. A spray through the nose. After your procedure is over, the medicine will be stopped. The procedure may vary among health care providers and hospitals. What happens after the procedure? Your blood pressure, heart rate, breathing rate, and blood oxygen level will be monitored until you leave the hospital or clinic. You may feel sleepy, clumsy, or nauseous. You may not remember what  happened during or after the procedure. Sedation can affect you for several hours. Do not drive or use machinery until your health care provider says that it is safe. This information is not intended to replace advice given to you by your health care provider. Make sure you discuss any questions you have with your health care provider. Document Revised: 04/09/2022 Document Reviewed: 04/09/2022 Elsevier Patient Education  2024 [employer].

## 2023-08-14 ENCOUNTER — Encounter (HOSPITAL_COMMUNITY): Payer: Self-pay

## 2023-08-14 ENCOUNTER — Encounter (HOSPITAL_COMMUNITY)
Admission: RE | Admit: 2023-08-14 | Discharge: 2023-08-14 | Disposition: A | Discharge status: Home / Self Care | Payer: BC Managed Care – PPO | Source: Ambulatory Visit | Attending: Internal Medicine | Admitting: Internal Medicine

## 2023-08-14 VITALS — BP 130/65 | HR 95 | Temp 97.8°F | Resp 18 | Ht 61.0 in | Wt 231.9 lb

## 2023-08-14 DIAGNOSIS — D175 Benign lipomatous neoplasm of intra-abdominal organs: Secondary | ICD-10-CM | POA: Diagnosis not present

## 2023-08-14 DIAGNOSIS — K589 Irritable bowel syndrome without diarrhea: Secondary | ICD-10-CM | POA: Diagnosis not present

## 2023-08-14 DIAGNOSIS — K2970 Gastritis, unspecified, without bleeding: Secondary | ICD-10-CM | POA: Diagnosis not present

## 2023-08-14 DIAGNOSIS — Z86010 Personal history of colonic polyps: Secondary | ICD-10-CM | POA: Diagnosis not present

## 2023-08-14 DIAGNOSIS — D125 Benign neoplasm of sigmoid colon: Secondary | ICD-10-CM | POA: Diagnosis not present

## 2023-08-14 DIAGNOSIS — I10 Essential (primary) hypertension: Secondary | ICD-10-CM | POA: Diagnosis not present

## 2023-08-14 DIAGNOSIS — K5730 Diverticulosis of large intestine without perforation or abscess without bleeding: Secondary | ICD-10-CM | POA: Diagnosis not present

## 2023-08-14 DIAGNOSIS — Z01812 Encounter for preprocedural laboratory examination: Secondary | ICD-10-CM | POA: Insufficient documentation

## 2023-08-14 DIAGNOSIS — Z9049 Acquired absence of other specified parts of digestive tract: Secondary | ICD-10-CM | POA: Diagnosis not present

## 2023-08-14 DIAGNOSIS — Z1211 Encounter for screening for malignant neoplasm of colon: Secondary | ICD-10-CM | POA: Diagnosis present

## 2023-08-14 DIAGNOSIS — T502X1A Poisoning by carbonic-anhydrase inhibitors, benzothiadiazides and other diuretics, accidental (unintentional), initial encounter: Secondary | ICD-10-CM | POA: Insufficient documentation

## 2023-08-14 DIAGNOSIS — K648 Other hemorrhoids: Secondary | ICD-10-CM | POA: Diagnosis not present

## 2023-08-14 DIAGNOSIS — G4730 Sleep apnea, unspecified: Secondary | ICD-10-CM | POA: Diagnosis not present

## 2023-08-14 DIAGNOSIS — K219 Gastro-esophageal reflux disease without esophagitis: Secondary | ICD-10-CM | POA: Diagnosis not present

## 2023-08-14 DIAGNOSIS — K317 Polyp of stomach and duodenum: Secondary | ICD-10-CM | POA: Diagnosis not present

## 2023-08-14 DIAGNOSIS — R1310 Dysphagia, unspecified: Secondary | ICD-10-CM | POA: Diagnosis not present

## 2023-08-14 DIAGNOSIS — K449 Diaphragmatic hernia without obstruction or gangrene: Secondary | ICD-10-CM | POA: Diagnosis not present

## 2023-08-14 DIAGNOSIS — K222 Esophageal obstruction: Secondary | ICD-10-CM | POA: Diagnosis not present

## 2023-08-14 LAB — BASIC METABOLIC PANEL WITH GFR
Anion gap: 8 (ref 5–15)
BUN: 9 mg/dL (ref 6–20)
CO2: 25 mmol/L (ref 22–32)
Calcium: 8.6 mg/dL — ABNORMAL LOW (ref 8.9–10.3)
Chloride: 106 mmol/L (ref 98–111)
Creatinine, Ser: 0.93 mg/dL (ref 0.44–1.00)
GFR, Estimated: >60 mL/min (ref >60)
Glucose, Bld: 123 mg/dL — ABNORMAL HIGH (ref 70–99)
Potassium: 3.8 mmol/L (ref 3.5–5.1)
Sodium: 139 mmol/L (ref 135–145)

## 2023-08-16 ENCOUNTER — Ambulatory Visit (HOSPITAL_COMMUNITY): Payer: BC Managed Care – PPO | Admitting: Anesthesiology

## 2023-08-16 ENCOUNTER — Encounter (HOSPITAL_COMMUNITY): Payer: Self-pay

## 2023-08-16 ENCOUNTER — Ambulatory Visit (HOSPITAL_COMMUNITY)
Admission: RE | Admit: 2023-08-16 | Discharge: 2023-08-16 | Disposition: A | Discharge status: Home / Self Care | Payer: BC Managed Care – PPO | Attending: Internal Medicine | Admitting: Internal Medicine

## 2023-08-16 ENCOUNTER — Encounter (HOSPITAL_COMMUNITY)
Admission: RE | Disposition: A | Discharge status: Home / Self Care | Payer: Self-pay | Source: Home / Self Care | Attending: Internal Medicine

## 2023-08-16 DIAGNOSIS — Z9049 Acquired absence of other specified parts of digestive tract: Secondary | ICD-10-CM | POA: Insufficient documentation

## 2023-08-16 DIAGNOSIS — K589 Irritable bowel syndrome without diarrhea: Secondary | ICD-10-CM | POA: Insufficient documentation

## 2023-08-16 DIAGNOSIS — G4730 Sleep apnea, unspecified: Secondary | ICD-10-CM | POA: Insufficient documentation

## 2023-08-16 DIAGNOSIS — D125 Benign neoplasm of sigmoid colon: Secondary | ICD-10-CM | POA: Diagnosis not present

## 2023-08-16 DIAGNOSIS — K2970 Gastritis, unspecified, without bleeding: Secondary | ICD-10-CM | POA: Diagnosis not present

## 2023-08-16 DIAGNOSIS — K5730 Diverticulosis of large intestine without perforation or abscess without bleeding: Secondary | ICD-10-CM | POA: Diagnosis not present

## 2023-08-16 DIAGNOSIS — Z1211 Encounter for screening for malignant neoplasm of colon: Secondary | ICD-10-CM | POA: Diagnosis not present

## 2023-08-16 DIAGNOSIS — D175 Benign lipomatous neoplasm of intra-abdominal organs: Secondary | ICD-10-CM | POA: Diagnosis not present

## 2023-08-16 DIAGNOSIS — K449 Diaphragmatic hernia without obstruction or gangrene: Secondary | ICD-10-CM | POA: Insufficient documentation

## 2023-08-16 DIAGNOSIS — K222 Esophageal obstruction: Secondary | ICD-10-CM | POA: Insufficient documentation

## 2023-08-16 DIAGNOSIS — I10 Essential (primary) hypertension: Secondary | ICD-10-CM | POA: Insufficient documentation

## 2023-08-16 DIAGNOSIS — K219 Gastro-esophageal reflux disease without esophagitis: Secondary | ICD-10-CM | POA: Insufficient documentation

## 2023-08-16 DIAGNOSIS — K317 Polyp of stomach and duodenum: Secondary | ICD-10-CM | POA: Insufficient documentation

## 2023-08-16 DIAGNOSIS — R12 Heartburn: Secondary | ICD-10-CM | POA: Diagnosis not present

## 2023-08-16 DIAGNOSIS — Z86010 Personal history of colonic polyps: Secondary | ICD-10-CM | POA: Diagnosis not present

## 2023-08-16 DIAGNOSIS — K648 Other hemorrhoids: Secondary | ICD-10-CM | POA: Insufficient documentation

## 2023-08-16 DIAGNOSIS — R1310 Dysphagia, unspecified: Secondary | ICD-10-CM | POA: Diagnosis not present

## 2023-08-16 HISTORY — PX: BIOPSY: SHX5522

## 2023-08-16 HISTORY — PX: BALLOON DILATION: SHX5330

## 2023-08-16 HISTORY — PX: COLONOSCOPY WITH PROPOFOL: SHX5780

## 2023-08-16 HISTORY — PX: ESOPHAGOGASTRODUODENOSCOPY (EGD) WITH PROPOFOL: SHX5813

## 2023-08-16 SURGERY — COLONOSCOPY WITH PROPOFOL
Anesthesia: General

## 2023-08-16 MED ORDER — PHENYLEPHRINE 80 MCG/ML (10ML) SYRINGE FOR IV PUSH (FOR BLOOD PRESSURE SUPPORT)
PREFILLED_SYRINGE | INTRAVENOUS | Status: AC
  Filled 2023-08-16: qty 10

## 2023-08-16 MED ORDER — VASOPRESSIN 20 UNIT/ML IV SOLN
INTRAVENOUS | Status: DC | PRN
Start: 2023-08-16 — End: 2023-08-16
  Administered 2023-08-16: 2 [IU] via INTRAVENOUS
  Administered 2023-08-16: 1 [IU] via INTRAVENOUS

## 2023-08-16 MED ORDER — PROPOFOL 10 MG/ML IV BOLUS
INTRAVENOUS | Status: DC | PRN
  Administered 2023-08-16: 100 mg via INTRAVENOUS

## 2023-08-16 MED ORDER — LACTATED RINGERS IV SOLN
INTRAVENOUS | Status: DC | PRN
Start: 2023-08-16 — End: 2023-08-16

## 2023-08-16 MED ORDER — ONDANSETRON HCL 4 MG/2ML IJ SOLN
INTRAMUSCULAR | Status: AC
  Filled 2023-08-16: qty 2

## 2023-08-16 MED ORDER — MIDAZOLAM HCL 2 MG/2ML IJ SOLN
INTRAMUSCULAR | Status: AC
  Filled 2023-08-16: qty 2

## 2023-08-16 MED ORDER — LIDOCAINE HCL (PF) 2 % IJ SOLN
INTRAMUSCULAR | Status: AC
  Filled 2023-08-16: qty 20

## 2023-08-16 MED ORDER — DEXMEDETOMIDINE HCL IN NACL 80 MCG/20ML IV SOLN
INTRAVENOUS | Status: DC | PRN
Start: 2023-08-16 — End: 2023-08-16
  Administered 2023-08-16: 12 ug via INTRAVENOUS

## 2023-08-16 MED ORDER — PROPOFOL 1000 MG/100ML IV EMUL
INTRAVENOUS | Status: AC
  Filled 2023-08-16: qty 100

## 2023-08-16 MED ORDER — MIDAZOLAM HCL 5 MG/5ML IJ SOLN
INTRAMUSCULAR | Status: DC | PRN
Start: 2023-08-16 — End: 2023-08-16
  Administered 2023-08-16: 2 mg via INTRAVENOUS

## 2023-08-16 MED ORDER — VASOPRESSIN 20 UNIT/ML IV SOLN
INTRAVENOUS | Status: AC
  Filled 2023-08-16: qty 1

## 2023-08-16 MED ORDER — SODIUM CHLORIDE FLUSH 0.9 % IV SOLN
INTRAVENOUS | Status: AC
  Filled 2023-08-16: qty 40

## 2023-08-16 MED ORDER — LIDOCAINE HCL (CARDIAC) PF 100 MG/5ML IV SOSY
PREFILLED_SYRINGE | INTRAVENOUS | Status: DC | PRN
  Administered 2023-08-16: 100 mg via INTRAVENOUS

## 2023-08-16 MED ORDER — PHENYLEPHRINE 80 MCG/ML (10ML) SYRINGE FOR IV PUSH (FOR BLOOD PRESSURE SUPPORT)
PREFILLED_SYRINGE | INTRAVENOUS | Status: DC | PRN
Start: 2023-08-16 — End: 2023-08-16
  Administered 2023-08-16 (×3): 240 ug via INTRAVENOUS

## 2023-08-16 MED ORDER — PROPOFOL 500 MG/50ML IV EMUL
INTRAVENOUS | Status: DC | PRN
  Administered 2023-08-16: 150 ug/kg/min via INTRAVENOUS

## 2023-08-16 NOTE — Discharge Instructions (Signed)
EGD Discharge instructions Please read the instructions outlined below and refer to this sheet in the next few weeks. These discharge instructions provide you with general information on caring for yourself after you leave the hospital. Your doctor may also give you specific instructions. While your treatment has been planned according to the most current medical practices available, unavoidable complications occasionally occur. If you have any problems or questions after discharge, please call your doctor. ACTIVITY You may resume your regular activity but move at a slower pace for the next 24 hours.  Take frequent rest periods for the next 24 hours.  Walking will help expel (get rid of) the air and reduce the bloated feeling in your abdomen.  No driving for 24 hours (because of the anesthesia (medicine) used during the test).  You may shower.  Do not sign any important legal documents or operate any machinery for 24 hours (because of the anesthesia used during the test).  NUTRITION Drink plenty of fluids.  You may resume your normal diet.  Begin with a light meal and progress to your normal diet.  Avoid alcoholic beverages for 24 hours or as instructed by your caregiver.  MEDICATIONS You may resume your normal medications unless your caregiver tells you otherwise.  WHAT YOU CAN EXPECT TODAY You may experience abdominal discomfort such as a feeling of fullness or "gas" pains.  FOLLOW-UP Your doctor will discuss the results of your test with you.  SEEK IMMEDIATE MEDICAL ATTENTION IF ANY OF THE FOLLOWING OCCUR: Excessive nausea (feeling sick to your stomach) and/or vomiting.  Severe abdominal pain and distention (swelling).  Trouble swallowing.  Temperature over 101 F (37.8 C).  Rectal bleeding or vomiting of blood.      Colonoscopy Discharge Instructions  Read the instructions outlined below and refer to this sheet in the next few weeks. These discharge instructions provide you  with general information on caring for yourself after you leave the hospital. Your doctor may also give you specific instructions. While your treatment has been planned according to the most current medical practices available, unavoidable complications occasionally occur.   ACTIVITY You may resume your regular activity, but move at a slower pace for the next 24 hours.  Take frequent rest periods for the next 24 hours.  Walking will help get rid of the air and reduce the bloated feeling in your belly (abdomen).  No driving for 24 hours (because of the medicine (anesthesia) used during the test).   Do not sign any important legal documents or operate any machinery for 24 hours (because of the anesthesia used during the test).  NUTRITION Drink plenty of fluids.  You may resume your normal diet as instructed by your doctor.  Begin with a light meal and progress to your normal diet. Heavy or fried foods are harder to digest and may make you feel sick to your stomach (nauseated).  Avoid alcoholic beverages for 24 hours or as instructed.  MEDICATIONS You may resume your normal medications unless your doctor tells you otherwise.  WHAT YOU CAN EXPECT TODAY Some feelings of bloating in the abdomen.  Passage of more gas than usual.  Spotting of blood in your stool or on the toilet paper.  IF YOU HAD POLYPS REMOVED DURING THE COLONOSCOPY: No aspirin products for 7 days or as instructed.  No alcohol for 7 days or as instructed.  Eat a soft diet for the next 24 hours.  FINDING OUT THE RESULTS OF YOUR TEST Not all test results  are available during your visit. If your test results are not back during the visit, make an appointment with your caregiver to find out the results. Do not assume everything is normal if you have not heard from your caregiver or the medical facility. It is important for you to follow up on all of your test results.  SEEK IMMEDIATE MEDICAL ATTENTION IF: You have more than a  spotting of blood in your stool.  Your belly is swollen (abdominal distention).  You are nauseated or vomiting.  You have a temperature over 101.  You have abdominal pain or discomfort that is severe or gets worse throughout the day.   Your EGD revealed mild amount inflammation in your stomach.  I took biopsies of this to rule out infection with a bacteria called H. pylori.  Await pathology results, my office will contact you.  You also have a small hiatal hernia and a tightening of your esophagus called a Schatzki's ring.  I stretched this out today.  Hopefully this helps with feeling of food getting stuck.  Continue on esomeprazole.  Your colonoscopy revealed 1 polyp(s) which I removed successfully. Await pathology results, my office will contact you. I recommend repeating colonoscopy in 5 years for surveillance purposes.   You also have diverticulosis and internal hemorrhoids. I would recommend increasing fiber in your diet or adding OTC Benefiber/Metamucil. Be sure to drink at least 4 to 6 glasses of water daily. Follow-up with GI in 3 months   I hope you have a great rest of your week!  Hennie Duos. Marletta Lor, D.O. Gastroenterology and Hepatology [facility]

## 2023-08-16 NOTE — Anesthesia Postprocedure Evaluation (Signed)
Anesthesia Post Note  Patient: [name]  Procedure(s) Performed: COLONOSCOPY WITH PROPOFOL ESOPHAGOGASTRODUODENOSCOPY (EGD) WITH PROPOFOL BALLOON DILATION BIOPSY  Patient location during evaluation: Phase II Anesthesia Type: General Level of consciousness: awake Pain management: pain level controlled Vital Signs Assessment: post-procedure vital signs reviewed and stable Respiratory status: spontaneous breathing and respiratory function stable Cardiovascular status: blood pressure returned to baseline and stable Postop Assessment: no headache and no apparent nausea or vomiting Anesthetic complications: no Comments: Late entry   No notable events documented.   Last Vitals:  Vitals:   08/16/23 0835 08/16/23 0836  BP: (!) 80/30 (!) 93/54  Pulse:    Resp:    Temp:    SpO2: 97% 96%    Last Pain:  Vitals:   08/16/23 0836  TempSrc:   PainSc: 0-No pain                 Windell Norfolk

## 2023-08-16 NOTE — Op Note (Signed)
[facility] Patient Name: [name] Procedure Date: 08/16/2023 7:25 AM MRN: [id number] Date of Birth: September 09, 1966 Attending MD: Hennie Duos. Marletta Lor , [place], [id number] CSN: [id number] Age: 57 Admit Type: Outpatient Procedure:                Colonoscopy Indications:              Surveillance: Personal history of adenomatous                            polyps on last colonoscopy > 3 years ago Providers:                Hennie Duos. Marletta Lor, DO, Nena Polio, RN, Elinor Parkinson Referring MD:             Hennie Duos. Marletta Lor, DO Medicines:                See the Anesthesia note for documentation of the                            administered medications Complications:            No immediate complications. Estimated Blood Loss:     Estimated blood loss was minimal. Procedure:                Pre-Anesthesia Assessment:                           - The anesthesia plan was to use monitored                            anesthesia care (MAC).                           After obtaining informed consent, the colonoscope                            was passed under direct vision. Throughout the                            procedure, the patient's blood pressure, pulse, and                            oxygen saturations were monitored continuously. The                            [id number]-HQ190L ([id number]) scope was introduced through                            the anus and advanced to the the cecum, identified                            by appendiceal orifice and ileocecal valve. The                            colonoscopy  was performed without difficulty. The                            patient tolerated the procedure well. The quality                            of the bowel preparation was evaluated using the                            BBPS [facility] Bowel Preparation Scale) with scores                            of: Right Colon = 3, Transverse Colon = 3 and Left                             Colon = 3 (entire mucosa seen well with no residual                            staining, small fragments of stool or opaque                            liquid). The total BBPS score equals 9. Scope In: 8:02:08 AM Scope Out: 8:12:51 AM Scope Withdrawal Time: 0 hours 8 minutes 14 seconds  Total Procedure Duration: 0 hours 10 minutes 43 seconds  Findings:      Non-bleeding internal hemorrhoids were found during endoscopy.      Multiple medium-mouthed and small-mouthed diverticula were found in the       sigmoid colon.      A 7 mm polyp was found in the sigmoid colon. The polyp was       semi-pedunculated. The polyp was removed with a cold snare. Resection       and retrieval were complete.      There was a medium-sized lipoma, at the hepatic flexure.      There was a small lipoma, in the transverse colon. Impression:               - Non-bleeding internal hemorrhoids.                           - Diverticulosis in the sigmoid colon.                           - One 7 mm polyp in the sigmoid colon, removed with                            a cold snare. Resected and retrieved.                           - Medium-sized lipoma at the hepatic flexure.                           - Small lipoma in the transverse colon. Moderate Sedation:      Per Anesthesia Care Recommendation:           - Patient has a contact  number available for                            emergencies. The signs and symptoms of potential                            delayed complications were discussed with the                            patient. Return to normal activities tomorrow.                            Written discharge instructions were provided to the                            patient.                           - Resume previous diet.                           - Continue present medications.                           - Await pathology results.                           - Repeat colonoscopy in 5 years for surveillance.                            - Return to GI clinic in 3 months. Procedure Code(s):        --- Professional ---                           [phone], Colonoscopy, flexible; with removal of                            tumor(s), polyp(s), or other lesion(s) by snare                            technique Diagnosis Code(s):        --- Professional ---                           Z86.010, Personal history of colonic polyps                           K64.8, Other hemorrhoids                           D12.5, Benign neoplasm of sigmoid colon                           D17.5, Benign lipomatous neoplasm of                            intra-abdominal organs  K57.30, Diverticulosis of large intestine without                            perforation or abscess without bleeding CPT copyright 2022 American Medical Association. All rights reserved. The codes documented in this report are preliminary and upon coder review may  be revised to meet current compliance requirements. Hennie Duos. Marletta Lor, DO Hennie Duos. Marletta Lor, DO 08/16/2023 8:18:52 AM This report has been signed electronically. Number of Addenda: 0

## 2023-08-16 NOTE — Transfer of Care (Addendum)
Immediate Anesthesia Transfer of Care Note  Patient: [name]  Procedure(s) Performed: COLONOSCOPY WITH PROPOFOL ESOPHAGOGASTRODUODENOSCOPY (EGD) WITH PROPOFOL BALLOON DILATION BIOPSY  Patient Location: Short Stay  Anesthesia Type:General  Level of Consciousness: drowsy and patient cooperative  Airway & Oxygen Therapy: Patient Spontanous Breathing and Patient connected to face mask oxygen  Post-op Assessment: Report given to RN and Post -op Vital signs reviewed and stable  Post vital signs: Reviewed and stable  Last Vitals:  Vitals Value Taken Time  BP 141/106 08/16/23   0822  Temp 36.9 08/16/23   0822  Pulse 81 08/16/23   0822  Resp 22 08/16/23   0822  SpO2 98% 08/16/23   0822    Last Pain:  Vitals:   08/16/23 0643  TempSrc: Oral  PainSc: 0-No pain      Patients Stated Pain Goal: 7 (08/16/23 1610)  Complications: No notable events documented.

## 2023-08-16 NOTE — H&P (Signed)
Primary Care Physician:  Assunta Found, MD Primary Gastroenterologist:  Dr. Marletta Lor  Pre-Procedure History & Physical: HPI:  [name] is a 57 y.o. female is here for an EGD with possible dilation for GERD, dysphagia and a colonoscopy to be performed for surveillance purposes, personal history of adenomatous colon polyps in 2020  Past Medical History:  Diagnosis Date   Anxiety    Cervical atypia, mild 12/2013   Sonohysterogram biopsy showed benign endometrium but inflamed mild atypia and squamous mucosa fragments recommend repeat Pap smear 6 months   GERD (gastroesophageal reflux disease)    HTN (hypertension)    Irritable bowel syndrome    Obesity    PONV (postoperative nausea and vomiting)    Sleep apnea    C PAP   Snoring    URI (upper respiratory infection)     Past Surgical History:  Procedure Laterality Date   BIOPSY  07/10/2019   Procedure: BIOPSY;  Surgeon: West Bali, MD;  [facility];  Service: Endoscopy;;   CERVICAL CONE BIOPSY  11/27/1987   CIN 1, ECC positive   CESAREAN SECTION  10/27/2003   CHOLECYSTECTOMY  11/26/2004   COLONOSCOPY  11/27/2007   Dr. Darrick Penna: frequent sigmoid colon diverticula, small internal hemrorhoids, colonic biopsies to assess for microscopic colitis, Benign    COLONOSCOPY N/A 07/10/2019   Diverticulosis, external/internal hemorrhoids, multiple tubular adenomas.  Next colonoscopy in 3 years.  Procedure: COLONOSCOPY;  Surgeon: West Bali, MD;  Location: [facility];  Service: Endoscopy;  Laterality: N/A;  10:30am   ESOPHAGOGASTRODUODENOSCOPY  2008   Small sliding hiatal hernia, nonerosive antral gastritis, H. pylori serologies checked by Dr. Karilyn Cota, results unavailable.  No signs of Barrett's.   GYNECOLOGIC CRYOSURGERY     MAXILLARY ANTROSTOMY Left 05/14/2016   Procedure: LEFT ENDOSCOPIC MAXILLARY ANTROSTOMY WITH FUSION NAVIGATION;  Surgeon: Newman Pies, MD;  Location: [facility];  Service: ENT;   Laterality: Left;  LEFT ENDOSCOPIC MAXILLARY ANTROSTOMY WITH FUSION NAVIGATION   NASAL SEPTOPLASTY W/ TURBINOPLASTY Bilateral 05/14/2016   Procedure: NASAL SEPTOPLASTY WITH TURBINATE REDUCTION;  Surgeon: Newman Pies, MD;  Location: [facility];  Service: ENT;  Laterality: Bilateral;  NASAL SEPTOPLASTY WITH TURBINATE REDUCTION   POLYPECTOMY  07/10/2019   Procedure: POLYPECTOMY;  Surgeon: West Bali, MD;  Location: [facility];  Service: Endoscopy;;   SINUS ENDO WITH FUSION Right 05/14/2016   Procedure: RIGHT ENDOSCOPIC  ETHMOIDECTOMY AND RIGHT ENDOSCOPIC FRONTAL SINUS EXPLORATION;  Surgeon: Newman Pies, MD;  Location: [facility];  Service: ENT;  Laterality: Right;  RIGHT ENDOSCOPIC  ETHMOIDECTOMY AND RIGHT ENDOSCOPIC FRONTAL SINUS EXPLORATION    Prior to Admission medications   Medication Sig Start Date End Date Taking? Authorizing Provider  esomeprazole (NEXIUM) 20 MG capsule Take 20 mg by mouth daily at 2 PM. In the afternoon.   Yes [provider]  olmesartan (BENICAR) 40 MG tablet Take 40 mg by mouth daily. 02/26/22  Yes [provider]  colestipol (COLESTID) 1 g tablet TAKE 1 TABLET(1 GRAM) BY MOUTH DAILY. DO NOT TAKE WITHIN 2 HOURS OF OTHER MEDICATIONS 03/12/23   Tiffany Kocher, PA-C    Allergies as of 07/05/2023 - Review Complete 06/24/2023  Allergen Reaction Noted   Penicillins Shortness Of Breath     Family History  Problem Relation Age of Onset   Hypertension Mother    Hypertension Father    Heart attack Father    Hypertension Sister    Breast cancer  Sister 46   Cervical cancer Sister    Colon polyps Neg Hx    Colon cancer Neg Hx     Social History   Socioeconomic History   Marital status: Married    Spouse name: Not on file   Number of children: Not on file   Years of education: Not on file   Highest education level: Not on file  Occupational History   Occupation: Geologist, engineering    Employer: [employer] [place]   Tobacco Use   Smoking status: Never   Smokeless tobacco: Never  Vaping Use   Vaping status: Never Used  Substance and Sexual Activity   Alcohol use: Yes    Alcohol/week: 0.0 standard drinks of alcohol    Comment: socially   Drug use: No   Sexual activity: Yes    Birth control/protection: Other-see comments    Comment: vasectomy-1st intercourse 18 yo-5 partners  Other Topics Concern   Not on file  Social History Narrative   Drinks 1 cup of coffee and 12 oz ginger ale.   Social Determinants of Health   Financial Resource Strain: Low Risk  (06/06/2021)   Overall Financial Resource Strain (CARDIA)    Difficulty of Paying Living Expenses: Not hard at all  Food Insecurity: No Food Insecurity (06/06/2021)   Hunger Vital Sign    Worried About Running Out of Food in the Last Year: Never true    Ran Out of Food in the Last Year: Never true  Transportation Needs: No Transportation Needs (06/06/2021)   PRAPARE - Administrator, Civil Service (Medical): No    Lack of Transportation (Non-Medical): No  Physical Activity: Insufficiently Active (06/06/2021)   Exercise Vital Sign    Days of Exercise per Week: 2 days    Minutes of Exercise per Session: 20 min  Stress: No Stress Concern Present (06/06/2021)   [employer] of Occupational Health - Occupational Stress Questionnaire    Feeling of Stress : Only a little  Social Connections: Socially Integrated (06/06/2021)   Social Connection and Isolation Panel [NHANES]    Frequency of Communication with Friends and Family: More than three times a week    Frequency of Social Gatherings with Friends and Family: Twice a week    Attends Religious Services: More than 4 times per year    Active Member of Clubs or Organizations: Yes    Attends Banker Meetings: More than 4 times per year    Marital Status: Married  Catering manager Violence: Not At Risk (06/06/2021)   Humiliation, Afraid, Rape, and Kick questionnaire     Fear of Current or Ex-Partner: No    Emotionally Abused: No    Physically Abused: No    Sexually Abused: No    Review of Systems: See HPI, otherwise negative ROS  Physical Exam: Vital signs in last 24 hours: Temp:  [97.8 F (36.6 C)] 97.8 F (36.6 C) (09/20 0643) Pulse Rate:  [88] 88 (09/20 0643) Resp:  [15] 15 (09/20 0643) BP: (124)/(107) (P) 101/87 (09/20 0647) SpO2:  [97 %] 97 % (09/20 0643) Weight:  [105.2 kg] 105.2 kg (09/20 0643)   General:   Alert,  Well-developed, well-nourished, pleasant and cooperative in NAD Head:  Normocephalic and atraumatic. Eyes:  Sclera clear, no icterus.   Conjunctiva pink. Ears:  Normal auditory acuity. Nose:  No deformity, discharge,  or lesions. Msk:  Symmetrical without gross deformities. Normal posture. Extremities:  Without clubbing or edema. Neurologic:  Alert and  oriented  x4;  grossly normal neurologically. Skin:  Intact without significant lesions or rashes. Psych:  Alert and cooperative. Normal mood and affect.  Impression/Plan: [name] is here for an EGD with possible dilation for GERD, dysphagia and a colonoscopy to be performed for surveillance purposes, personal history of adenomatous colon polyps in 2020  The risks of the procedure including infection, bleed, or perforation as well as benefits, limitations, alternatives and imponderables have been reviewed with the patient. Questions have been answered. All parties agreeable.

## 2023-08-16 NOTE — Op Note (Addendum)
[facility] Patient Name: [name] Procedure Date: 08/16/2023 7:26 AM MRN: [id number] Date of Birth: [birth year] Attending MD: Hennie Duos. Marletta Lor , [place], [id number] CSN: [id number] Age: 57 Admit Type: Outpatient Procedure:                Upper GI endoscopy Indications:              Dysphagia, Heartburn Providers:                Hennie Duos. Marletta Lor, DO, Nena Polio, RN, Elinor Parkinson Referring MD:             Hennie Duos. Marletta Lor, DO Medicines:                See the Anesthesia note for documentation of the                            administered medications Complications:            No immediate complications. Estimated Blood Loss:     Estimated blood loss was minimal. Procedure:                Pre-Anesthesia Assessment:                           - The anesthesia plan was to use monitored                            anesthesia care (MAC).                           After obtaining informed consent, the endoscope was                            passed under direct vision. Throughout the                            procedure, the patient's blood pressure, pulse, and                            oxygen saturations were monitored continuously. The                            [id number]-H190 ([id number]) scope was introduced through the                            mouth, and advanced to the second part of duodenum.                            The upper GI endoscopy was accomplished without                            difficulty. The patient tolerated the procedure                            well. Scope In:  7:51:13 AM Scope Out: 7:57:45 AM Total Procedure Duration: 0 hours 6 minutes 32 seconds  Findings:      A 3 cm hiatal hernia was present.      A mild Schatzki ring was found in the distal esophagus. A TTS dilator       was passed through the scope. Dilation with a 15-16.5-18 mm balloon       dilator was performed to 18 mm. The dilation site was examined and       showed  moderate improvement in luminal narrowing. Ring then further       disrupted with cold forceps.      Patchy mild inflammation characterized by erythema was found in the       entire examined stomach. Biopsies were taken with a cold forceps for       Helicobacter pylori testing.      Multiple small fundic gland polyps with no bleeding and no stigmata of       recent bleeding were found in the gastric fundus and in the gastric body.      The duodenal bulb, first portion of the duodenum and second portion of       the duodenum were normal. Impression:               - 3 cm hiatal hernia.                           - Mild Schatzki ring. Dilated.                           - Gastritis. Biopsied.                           - Multiple gastric polyps.                           - Normal duodenal bulb, first portion of the                            duodenum and second portion of the duodenum. Moderate Sedation:      Per Anesthesia Care Recommendation:           - Patient has a contact number available for                            emergencies. The signs and symptoms of potential                            delayed complications were discussed with the                            patient. Return to normal activities tomorrow.                            Written discharge instructions were provided to the                            patient.                           -  Resume previous diet.                           - Continue present medications.                           - Await pathology results.                           - Repeat upper endoscopy PRN for retreatment.                           - Return to GI clinic in 3 months. Procedure Code(s):        --- Professional ---                           [phone], Esophagogastroduodenoscopy, flexible,                            transoral; with transendoscopic balloon dilation of                            esophagus (less than 30 mm diameter)                            [id number], [birth year], Esophagogastroduodenoscopy, flexible,                            transoral; with biopsy, single or multiple Diagnosis Code(s):        --- Professional ---                           K44.9, Diaphragmatic hernia without obstruction or                            gangrene                           K22.2, Esophageal obstruction                           K29.70, Gastritis, unspecified, without bleeding                           K31.7, Polyp of stomach and duodenum                           R13.10, Dysphagia, unspecified                           R12, Heartburn CPT copyright 2022 American Medical Association. All rights reserved. The codes documented in this report are preliminary and upon coder review may  be revised to meet current compliance requirements. Hennie Duos. Marletta Lor, DO Hennie Duos. Julia Pflug, DO 08/16/2023 8:01:27 AM This report has been signed electronically. Number of Addenda: 0

## 2023-08-16 NOTE — Anesthesia Preprocedure Evaluation (Signed)
Anesthesia Evaluation  Patient identified by MRN, date of birth, ID band Patient awake    Reviewed: Allergy & Precautions, H&P , NPO status , Patient's Chart, lab work & pertinent test results, reviewed documented beta blocker date and time   History of Anesthesia Complications (+) PONV and history of anesthetic complications  Airway Mallampati: II  TM Distance: >3 FB Neck ROM: full    Dental no notable dental hx.    Pulmonary neg pulmonary ROS, sleep apnea    Pulmonary exam normal breath sounds clear to auscultation       Cardiovascular Exercise Tolerance: Good hypertension, + DOE  negative cardio ROS  Rhythm:regular Rate:Normal     Neuro/Psych  PSYCHIATRIC DISORDERS Anxiety     negative neurological ROS  negative psych ROS   GI/Hepatic negative GI ROS, Neg liver ROS,GERD  ,,  Endo/Other  negative endocrine ROS    Renal/GU negative Renal ROS  negative genitourinary   Musculoskeletal   Abdominal   Peds  Hematology negative hematology ROS (+)   Anesthesia Other Findings   Reproductive/Obstetrics negative OB ROS                             Anesthesia Physical Anesthesia Plan  ASA: 2  Anesthesia Plan: General   Post-op Pain Management:    Induction:   PONV Risk Score and Plan: Propofol infusion  Airway Management Planned:   Additional Equipment:   Intra-op Plan:   Post-operative Plan:   Informed Consent: I have reviewed the patients History and Physical, chart, labs and discussed the procedure including the risks, benefits and alternatives for the proposed anesthesia with the patient or authorized representative who has indicated his/her understanding and acceptance.     Dental Advisory Given  Plan Discussed with: CRNA  Anesthesia Plan Comments:        Anesthesia Quick Evaluation

## 2023-08-19 LAB — SURGICAL PATHOLOGY

## 2023-08-27 ENCOUNTER — Encounter (HOSPITAL_COMMUNITY): Payer: Self-pay | Admitting: Internal Medicine

## 2023-09-25 ENCOUNTER — Encounter: Payer: Self-pay | Admitting: Gastroenterology

## 2023-09-25 ENCOUNTER — Ambulatory Visit: Payer: BC Managed Care – PPO | Admitting: Gastroenterology

## 2023-09-25 VITALS — BP 122/76 | HR 94 | Temp 98.1°F | Ht 61.0 in | Wt 230.4 lb

## 2023-09-25 DIAGNOSIS — K5792 Diverticulitis of intestine, part unspecified, without perforation or abscess without bleeding: Secondary | ICD-10-CM

## 2023-09-25 DIAGNOSIS — K5732 Diverticulitis of large intestine without perforation or abscess without bleeding: Secondary | ICD-10-CM | POA: Insufficient documentation

## 2023-09-25 MED ORDER — CIPROFLOXACIN HCL 500 MG PO TABS: 500.0000 mg | ORAL_TABLET | Freq: Two times a day (BID) | ORAL | 0 refills | Status: AC

## 2023-09-25 MED ORDER — METRONIDAZOLE 500 MG PO TABS
500.0000 mg | ORAL_TABLET | Freq: Three times a day (TID) | ORAL | 0 refills | Status: AC
Start: 2023-09-25 — End: 2023-10-05

## 2023-09-25 NOTE — Progress Notes (Signed)
GI Office Note    Referring Provider: Assunta Found, MD Primary Care Physician:  Assunta Found, MD  Primary Gastroenterologist: Hennie Duos. Marletta Lor, DO   Chief Complaint   Chief Complaint  Patient presents with   Abdominal Pain    Having abdominal pain. Had fever yesterday.     History of Present Illness   [name] is a 57 y.o. female presenting today for acute visit for LLQ pain/fever.   Woke up at 1am yesterday morning with LLQ pain. Notes chills/fever. Started taking ibuprofen but transitioned to tylenol. Notes stools different yesterday, bristol 1. Chronically on colestipol for loose stools. Throughout the day had persistent LLQ. Fever resolved. Still with LLQ pain today although somewhat improved. Was able to work. Denies N/V. Stools better today. No melena, brbpr. No dysuria. No prior episodes of diverticulitis. She did try limiting diet yesterday and today, not hungry.    EGD September 2024: -3 cm hiatal hernia -Mild Schatzki ring status post dilation -Gastritis status post biopsy benign no H. pylori -Multiple gastric polyps  Colonoscopy September 2024: -Nonbleeding internal hemorrhoids -Diverticulosis in the sigmoid colon -one 7 mm polyp in the sigmoid colon removed, tubular adenoma -Medium sized lipoma at the hepatic flexure -Small lipoma in the transverse colon -colonoscopy in 5 years  Medications   Current Outpatient Medications  Medication Sig Dispense Refill   colestipol (COLESTID) 1 g tablet TAKE 1 TABLET(1 GRAM) BY MOUTH DAILY. DO NOT TAKE WITHIN 2 HOURS OF OTHER MEDICATIONS 90 tablet 3   esomeprazole (NEXIUM) 20 MG capsule Take 20 mg by mouth daily at 2 PM. In the afternoon.     olmesartan (BENICAR) 40 MG tablet Take 40 mg by mouth daily.     No current facility-administered medications for this visit.    Allergies   Allergies as of 09/25/2023 - Review Complete 09/25/2023  Allergen Reaction Noted   Penicillins Shortness Of Breath        Review of Systems   General: Negative for anorexia, weight loss, fatigue, weakness. See hpi ENT: Negative for hoarseness, difficulty swallowing , nasal congestion. CV: Negative for chest pain, angina, palpitations, dyspnea on exertion, peripheral edema.  Respiratory: Negative for dyspnea at rest, dyspnea on exertion, cough, sputum, wheezing.  GI: See history of present illness. GU:  Negative for dysuria, hematuria, urinary incontinence, urinary frequency, nocturnal urination.  Endo: Negative for unusual weight change.     Physical Exam   BP 122/76 (BP Location: Right Arm, Patient Position: Sitting, Cuff Size: Large)   Pulse 94   Temp 98.1 F (36.7 C) (Oral)   Ht 5\' 1"  (1.549 m)   Wt 230 lb 6.4 oz (104.5 kg)   LMP 12/05/2013   SpO2 96%   BMI 43.53 kg/m    General: Well-nourished, well-developed in no acute distress.  Eyes: No icterus. Mouth: Oropharyngeal mucosa moist and pink  Abdomen: Bowel sounds are normal, nondistended, no hepatosplenomegaly or masses,  no abdominal bruits or hernia , no rebound or guarding. Moderate LLQ. Rectal: not performed  Extremities: No lower extremity edema. No clubbing or deformities. Neuro: Alert and oriented x 4   Skin: Warm and dry, no jaundice.   Psych: Alert and cooperative, normal mood and affect.  Labs   None available  Imaging Studies   No results found.  Assessment/Plan:   Acute diverticulitis -no alarm symptoms -treat with cipro/flagyl (given penicillin allergy) -if persistent or worsening symptoms she will require CT -transition to low fiber diet preferably liquid/soft foods  the next 48 hours and gradually increase diet as tolerated. Goal of high fiber diet after episode resolves. Written handout provided     [name]. Melvyn Neth, MHS, PA-C [facility]

## 2023-09-25 NOTE — Patient Instructions (Signed)
Complete 10 days of Cipro and Flagyl.   For the next 24 hours, try eating liquid diet. Avoid raw vegetables until your symptoms subside. See information below.  Call if worsening or persistent abdominal pain.   Diverticulosis/ Diverticulitis Information and Diet:    Diverticulosis is a condition in which small, bulging pouches (diverticuli) form inside the lower part of the intestine, usually in the colon. Constipation and straining during bowel movements can worsen the condition. A diet rich in fiber can help keep stools soft and prevent inflammation.   Diverticulitis occurs when the pouches in the colon become infected or inflamed. Dietary changes can help the colon heal.   Fiber is an important part of the diet for patients with diverticulosis. A high-fiber diet softens and gives bulk to the stool, allowing it to pass quickly and easily.   Diet for Diverticulosis Eat a high-fiber diet when you have diverticulosis. Fiber softens the stool and helps prevent constipation. It also can help decrease pressure in the colon and help prevent flare-ups of diverticulitis.   High-fiber foods include:   Beans and legumes Bran, whole wheat bread and whole grain cereals such as oatmeal Brown and wild rice Fruits such as apples, bananas and pears Vegetables such as broccoli, carrots, corn and squash Whole wheat pasta If you currently don't have a diet high in fiber, you should add fiber gradually. This helps avoid bloating and abdominal discomfort. The target is to eat 25 to 30 grams of fiber daily. Drink at least 8 cups of fluid daily. Fluid will help soften your stool. Exercise also promotes bowel movement and helps prevent constipation.   When the colon is not inflamed, eat popcorn, nuts and seeds as tolerated.   Diet for Diverticulitis (flares) During flare ups of diverticulitis, follow a clear liquid diet. Your doctor will let you know when to progress from clear liquids to low fiber solids and  then back to your normal diet.   A clear liquid diet means no solid foods. Juices should have no pulp. During the clear liquid diet, you may consume:   Broth Clear juices such as apple, cranberry and grape. (Avoid orange juice) Jell-O Popsicles   When you're able to eat solid food, choose low fiber foods while healing. Low fiber foods include:   Canned or cooked fruit without seeds or skin, such as applesauce and melon Canned or well cooked vegetables without seeds and skin Dairy products such as cheese, milk and yogurt Eggs Low-fiber cereal Meat that is ground or tender and well cooked Pasta White bread and white rice AVOID RED MEAT WHILE YOU HAVE AN ACTIVE FLARE.  AVOID NSAIDS, IBUPROFEN, ALEVE, ASPIRIN WHILE YOU HAVE AN ACTIVE FLARE.  EXERCISE AS WELL AS SMOKING CESSATION CAN HELP PREVENT RECURRENCES.    After symptoms improve, usually within two to four days, you may add 5 to 15 grams of fiber a day back into your diet. Resume your high fiber diet when you no longer have symptoms.

## 2023-10-10 ENCOUNTER — Encounter: Payer: Self-pay | Admitting: Internal Medicine

## 2023-10-14 ENCOUNTER — Encounter: Payer: Self-pay | Admitting: Internal Medicine

## 2024-03-03 ENCOUNTER — Other Ambulatory Visit (HOSPITAL_COMMUNITY): Payer: Self-pay | Admitting: Family Medicine

## 2024-03-03 DIAGNOSIS — Z1231 Encounter for screening mammogram for malignant neoplasm of breast: Secondary | ICD-10-CM

## 2024-03-23 ENCOUNTER — Other Ambulatory Visit: Payer: Self-pay | Admitting: Gastroenterology

## 2024-06-09 ENCOUNTER — Ambulatory Visit: Payer: Self-pay | Admitting: Adult Health

## 2024-06-10 ENCOUNTER — Ambulatory Visit (HOSPITAL_COMMUNITY)
Admission: RE | Admit: 2024-06-10 | Discharge: 2024-06-10 | Disposition: A | Discharge status: Home / Self Care | Payer: Self-pay | Source: Ambulatory Visit | Attending: Family Medicine | Admitting: Family Medicine

## 2024-06-10 ENCOUNTER — Ambulatory Visit: Payer: Self-pay | Admitting: Adult Health

## 2024-06-10 ENCOUNTER — Other Ambulatory Visit (HOSPITAL_COMMUNITY)
Admission: RE | Admit: 2024-06-10 | Discharge: 2024-06-10 | Disposition: A | Discharge status: Home / Self Care | Source: Ambulatory Visit | Attending: Adult Health | Admitting: Adult Health

## 2024-06-10 ENCOUNTER — Encounter (HOSPITAL_COMMUNITY): Payer: Self-pay

## 2024-06-10 ENCOUNTER — Encounter: Payer: Self-pay | Admitting: Adult Health

## 2024-06-10 VITALS — BP 102/75 | HR 79 | Ht 61.0 in | Wt 235.0 lb

## 2024-06-10 DIAGNOSIS — Z01419 Encounter for gynecological examination (general) (routine) without abnormal findings: Secondary | ICD-10-CM

## 2024-06-10 DIAGNOSIS — Z1231 Encounter for screening mammogram for malignant neoplasm of breast: Secondary | ICD-10-CM | POA: Diagnosis present

## 2024-06-10 DIAGNOSIS — Z1331 Encounter for screening for depression: Secondary | ICD-10-CM

## 2024-06-10 DIAGNOSIS — Z1211 Encounter for screening for malignant neoplasm of colon: Secondary | ICD-10-CM | POA: Diagnosis not present

## 2024-06-10 LAB — HEMOCCULT GUIAC POC 1CARD (OFFICE): Fecal Occult Blood, POC: NEGATIVE

## 2024-06-10 NOTE — Progress Notes (Signed)
 Patient ID: [name], female   DOB: December 11, 1965, 58 y.o.   MRN: [id number] History of Present Illness: [name] is a 58 year old white female,married, PM in for a well woman gyn exam and pap.  PCP is Dr Marvine   Current Medications, Allergies, Past Medical History, Past Surgical History, Family History and Social History were reviewed in [employer] electronic medical record.     Review of Systems: Patient denies any headaches, hearing loss, fatigue, blurred vision, shortness of breath, chest pain, abdominal pain, problems with bowel movements, urination(has to pee at night several times), or intercourse. No joint pain or mood swings.  Denies any vaginal bleeding   Physical Exam:BP 102/75 (BP Location: Left Arm, Patient Position: Sitting, Cuff Size: Large)   Pulse 79   Ht 5' 1 (1.549 m)   Wt 235 lb (106.6 kg)   LMP 12/05/2013   BMI 44.40 kg/m   General:  Well developed, well nourished, no acute distress Skin:  Warm and dry, has has multiple skin tags, under breasts and inner thighs. Neck:  Midline trachea, normal thyroid, good ROM, no lymphadenopathy, no carotid bruits heard Lungs; Clear to auscultation bilaterally Breast:  No dominant palpable mass, retraction, or nipple discharge Cardiovascular: Regular rate and rhythm Abdomen:  Soft, non tender, no hepatosplenomegaly Pelvic:  External genitalia is normal in appearance, no lesions.  The vagina is pale. Urethra has no lesions or masses. The cervix is smooth.  Uterus is felt to be normal size, shape, and contour.  No adnexal masses or tenderness noted.Bladder is non tender, no masses felt. Rectal: Good sphincter tone, no polyps, or hemorrhoids felt.  Hemoccult negative. Extremities/musculoskeletal:  No swelling or varicosities noted, no clubbing or cyanosis Psych:  No mood changes, alert and cooperative,seems happy AA is 2 Fall risk is low    06/10/2024   10:47 AM 06/06/2021    2:31 PM  Depression screen PHQ 2/9   Decreased Interest 0 0  Down, Depressed, Hopeless 0 0  PHQ - 2 Score 0 0  Altered sleeping 2 1  Tired, decreased energy 2 1  Change in appetite 1 1  Feeling bad or failure about yourself  0 0  Trouble concentrating 0 0  Moving slowly or fidgety/restless 0 0  Suicidal thoughts 0 0  PHQ-9 Score 5 3       06/10/2024   10:47 AM 06/06/2021    2:32 PM  GAD 7 : Generalized Anxiety Score  Nervous, Anxious, on Edge 1 0  Control/stop worrying 0 0  Worry too much - different things 1 0  Trouble relaxing 0 0  Restless 0 0  Easily annoyed or irritable 0 1  Afraid - awful might happen 0 0  Total GAD 7 Score 2 1    Upstream - 06/10/24 1052       Pregnancy Intention Screening   Does the patient want to become pregnant in the next year? N/A    Does the patient's partner want to become pregnant in the next year? N/A    Would the patient like to discuss contraceptive options today? N/A      Contraception Wrap Up   Current Method Vasectomy   PM   End Method Vasectomy   PM   Contraception Counseling Provided No         Examination chaperoned by Clarita Salt LPN     Impression and Plan: 1. Encounter for gynecological examination with Papanicolaou smear of cervix (Primary) Pap sent Pap in 3  years if normal Physical and labs with PCP Had mammogram today Colonoscopy per GI  - Cytology - PAP( )  2. Encounter for screening fecal occult blood testing Hemoccult was negative

## 2024-06-16 ENCOUNTER — Ambulatory Visit: Payer: Self-pay | Admitting: Women's Health

## 2024-06-16 LAB — CYTOLOGY - PAP
Adequacy: ABSENT
Comment: NEGATIVE
Diagnosis: NEGATIVE
High risk HPV: NEGATIVE

## 2024-11-02 ENCOUNTER — Other Ambulatory Visit (HOSPITAL_COMMUNITY): Payer: Self-pay | Admitting: Chiropractic Medicine

## 2024-11-02 DIAGNOSIS — M25561 Pain in right knee: Secondary | ICD-10-CM

## 2024-11-03 ENCOUNTER — Ambulatory Visit (HOSPITAL_COMMUNITY)
Admission: RE | Admit: 2024-11-03 | Discharge: 2024-11-03 | Disposition: A | Source: Ambulatory Visit | Attending: Chiropractic Medicine | Admitting: Chiropractic Medicine

## 2024-11-03 DIAGNOSIS — M25561 Pain in right knee: Secondary | ICD-10-CM

## 2024-11-17 ENCOUNTER — Other Ambulatory Visit: Payer: Self-pay | Admitting: Family Medicine

## 2024-11-17 DIAGNOSIS — M79604 Pain in right leg: Secondary | ICD-10-CM

## 2024-11-23 ENCOUNTER — Encounter: Payer: Self-pay | Admitting: Family Medicine

## 2024-11-25 ENCOUNTER — Ambulatory Visit
Admission: RE | Admit: 2024-11-25 | Discharge: 2024-11-25 | Disposition: A | Discharge status: Home / Self Care | Source: Ambulatory Visit | Attending: Family Medicine | Admitting: Family Medicine

## 2024-11-25 DIAGNOSIS — M79604 Pain in right leg: Secondary | ICD-10-CM

## 2025-04-21 ENCOUNTER — Ambulatory Visit: Admitting: Family Medicine
